# Patient Record
Sex: Male | Born: 1935 | Race: Black or African American | Hispanic: No | Smoking: Never smoker
Health system: Southern US, Community
[De-identification: ages and names within clinical notes are randomized; demographics above are authoritative.]

## PROBLEM LIST (undated history)

## (undated) DIAGNOSIS — G4730 Sleep apnea, unspecified: Secondary | ICD-10-CM

## (undated) DIAGNOSIS — I10 Essential (primary) hypertension: Secondary | ICD-10-CM

## (undated) HISTORY — PX: JOINT REPLACEMENT: SHX530

---

## 2005-06-05 ENCOUNTER — Ambulatory Visit: Payer: Self-pay | Admitting: Gastroenterology

## 2006-09-03 ENCOUNTER — Other Ambulatory Visit: Payer: Self-pay

## 2006-09-03 ENCOUNTER — Ambulatory Visit: Payer: Self-pay | Admitting: Specialist

## 2006-09-11 ENCOUNTER — Inpatient Hospital Stay: Payer: Self-pay | Admitting: Specialist

## 2006-12-12 ENCOUNTER — Ambulatory Visit: Payer: Self-pay | Admitting: Family Medicine

## 2006-12-12 ENCOUNTER — Other Ambulatory Visit: Payer: Self-pay

## 2006-12-12 ENCOUNTER — Inpatient Hospital Stay: Payer: Self-pay | Admitting: Rheumatology

## 2007-02-04 ENCOUNTER — Ambulatory Visit: Payer: Self-pay | Admitting: Gastroenterology

## 2007-03-08 ENCOUNTER — Ambulatory Visit: Payer: Self-pay | Admitting: Gastroenterology

## 2008-06-22 ENCOUNTER — Ambulatory Visit: Payer: Self-pay | Admitting: Family Medicine

## 2008-07-08 ENCOUNTER — Ambulatory Visit: Payer: Self-pay | Admitting: Specialist

## 2008-08-25 ENCOUNTER — Inpatient Hospital Stay (HOSPITAL_COMMUNITY): Admission: RE | Admit: 2008-08-25 | Discharge: 2008-08-31 | Payer: Self-pay | Admitting: Orthopedic Surgery

## 2008-10-19 ENCOUNTER — Inpatient Hospital Stay (HOSPITAL_COMMUNITY): Admission: RE | Admit: 2008-10-19 | Discharge: 2008-10-23 | Payer: Self-pay | Admitting: Orthopedic Surgery

## 2008-10-19 ENCOUNTER — Encounter (INDEPENDENT_AMBULATORY_CARE_PROVIDER_SITE_OTHER): Payer: Self-pay | Admitting: Orthopedic Surgery

## 2008-11-24 ENCOUNTER — Encounter: Payer: Self-pay | Admitting: Orthopedic Surgery

## 2008-12-06 ENCOUNTER — Encounter: Payer: Self-pay | Admitting: Orthopedic Surgery

## 2009-01-06 ENCOUNTER — Encounter: Payer: Self-pay | Admitting: Orthopedic Surgery

## 2009-06-14 ENCOUNTER — Ambulatory Visit: Payer: Self-pay | Admitting: Gastroenterology

## 2009-11-16 ENCOUNTER — Ambulatory Visit: Payer: Self-pay | Admitting: Internal Medicine

## 2010-08-15 LAB — BASIC METABOLIC PANEL
BUN: 11 mg/dL (ref 6–23)
CO2: 28 mEq/L (ref 19–32)
CO2: 31 mEq/L (ref 19–32)
Calcium: 8.2 mg/dL — ABNORMAL LOW (ref 8.4–10.5)
Chloride: 101 mEq/L (ref 96–112)
Chloride: 101 mEq/L (ref 96–112)
Creatinine, Ser: 1.34 mg/dL (ref 0.4–1.5)
Creatinine, Ser: 0.84 mg/dL (ref 0.4–1.5)
GFR calc Af Amer: >60 mL/min (ref >60)
GFR calc non Af Amer: >60 mL/min (ref >60)
Potassium: 3.6 mEq/L (ref 3.5–5.1)
Potassium: 4.1 mEq/L (ref 3.5–5.1)
Potassium: 4.1 mEq/L (ref 3.5–5.1)

## 2010-08-15 LAB — CBC
HCT: 29.7 % — ABNORMAL LOW (ref 39.0–52.0)
HCT: 32.3 % — ABNORMAL LOW (ref 39.0–52.0)
HCT: 35.3 % — ABNORMAL LOW (ref 39.0–52.0)
HCT: 47.4 % (ref 39.0–52.0)
Hemoglobin: 10 g/dL — ABNORMAL LOW (ref 13.0–17.0)
Hemoglobin: 9.4 g/dL — ABNORMAL LOW (ref 13.0–17.0)
Hemoglobin: 16 g/dL (ref 13.0–17.0)
MCHC: 33.7 g/dL (ref 30.0–36.0)
MCHC: 33.8 g/dL (ref 30.0–36.0)
MCHC: 33.8 g/dL (ref 30.0–36.0)
MCV: 88.8 fL (ref 78.0–100.0)
MCV: 89 fL (ref 78.0–100.0)
MCV: 88.4 fL (ref 78.0–100.0)
Platelets: 160 10*3/uL (ref 150–400)
Platelets: 191 10*3/uL (ref 150–400)
RBC: 3.13 MIL/uL — ABNORMAL LOW (ref 4.22–5.81)
RBC: 3.33 MIL/uL — ABNORMAL LOW (ref 4.22–5.81)
RBC: 3.64 MIL/uL — ABNORMAL LOW (ref 4.22–5.81)
RDW: 14.8 % (ref 11.5–15.5)
RDW: 14.8 % (ref 11.5–15.5)
WBC: 13.3 10*3/uL — ABNORMAL HIGH (ref 4.0–10.5)
WBC: 14.1 10*3/uL — ABNORMAL HIGH (ref 4.0–10.5)
WBC: 7 10*3/uL (ref 4.0–10.5)

## 2010-08-15 LAB — PROTIME-INR
INR: 2.4 — ABNORMAL HIGH (ref 0.00–1.49)
INR: 1.1 (ref 0.00–1.49)
Prothrombin Time: 24.9 seconds — ABNORMAL HIGH (ref 11.6–15.2)
Prothrombin Time: 17.6 seconds — ABNORMAL HIGH (ref 11.6–15.2)

## 2010-08-15 LAB — TYPE AND SCREEN
ABO/RH(D): O POS
Antibody Screen: NEGATIVE

## 2010-08-15 LAB — TISSUE CULTURE: Culture: NO GROWTH

## 2010-08-15 LAB — COMPREHENSIVE METABOLIC PANEL
ALT: <8 U/L (ref 0–53)
Albumin: 3 g/dL — ABNORMAL LOW (ref 3.5–5.2)
Alkaline Phosphatase: 90 U/L (ref 39–117)
BUN: 10 mg/dL (ref 6–23)
Chloride: 106 mEq/L (ref 96–112)
Potassium: 4.8 mEq/L (ref 3.5–5.1)
Sodium: 147 mEq/L — ABNORMAL HIGH (ref 135–145)
Total Bilirubin: 0.4 mg/dL (ref 0.3–1.2)

## 2010-08-15 LAB — URINALYSIS, ROUTINE W REFLEX MICROSCOPIC
Glucose, UA: NEGATIVE mg/dL
Hgb urine dipstick: NEGATIVE
Specific Gravity, Urine: 1.03 (ref 1.005–1.030)
Urobilinogen, UA: 1 mg/dL (ref 0.0–1.0)
pH: 6.5 (ref 5.0–8.0)

## 2010-08-15 LAB — URINE MICROSCOPIC-ADD ON

## 2010-08-15 LAB — ANAEROBIC CULTURE

## 2010-08-17 LAB — CBC
HCT: 32.2 % — ABNORMAL LOW (ref 39.0–52.0)
HCT: 32.3 % — ABNORMAL LOW (ref 39.0–52.0)
Hemoglobin: 10.9 g/dL — ABNORMAL LOW (ref 13.0–17.0)
Hemoglobin: 11 g/dL — ABNORMAL LOW (ref 13.0–17.0)
MCHC: 33.4 g/dL (ref 30.0–36.0)
MCV: 86.8 fL (ref 78.0–100.0)
MCV: 88.2 fL (ref 78.0–100.0)
Platelets: 247 10*3/uL (ref 150–400)
RBC: 3.65 MIL/uL — ABNORMAL LOW (ref 4.22–5.81)
RBC: 3.7 MIL/uL — ABNORMAL LOW (ref 4.22–5.81)
RBC: 4.22 MIL/uL (ref 4.22–5.81)
RDW: 13.7 % (ref 11.5–15.5)
RDW: 13.5 % (ref 11.5–15.5)
WBC: 7.6 10*3/uL (ref 4.0–10.5)
WBC: 8.1 10*3/uL (ref 4.0–10.5)
WBC: 8.3 10*3/uL (ref 4.0–10.5)

## 2010-08-17 LAB — BASIC METABOLIC PANEL
Chloride: 103 mEq/L (ref 96–112)
Creatinine, Ser: 0.74 mg/dL (ref 0.4–1.5)
GFR calc Af Amer: >60 mL/min (ref >60)
GFR calc Af Amer: >60 mL/min (ref >60)
GFR calc non Af Amer: >60 mL/min (ref >60)
Glucose, Bld: 119 mg/dL — ABNORMAL HIGH (ref 70–99)
Potassium: 3.5 mEq/L (ref 3.5–5.1)
Potassium: 4.3 mEq/L (ref 3.5–5.1)
Sodium: 136 mEq/L (ref 135–145)

## 2010-08-17 LAB — PROTIME-INR
INR: 2.1 — ABNORMAL HIGH (ref 0.00–1.49)
INR: 2.2 — ABNORMAL HIGH (ref 0.00–1.49)
INR: 2.2 — ABNORMAL HIGH (ref 0.00–1.49)
INR: 1.2 (ref 0.00–1.49)
Prothrombin Time: 15.5 seconds — ABNORMAL HIGH (ref 11.6–15.2)
Prothrombin Time: 25.2 seconds — ABNORMAL HIGH (ref 11.6–15.2)
Prothrombin Time: 25.4 seconds — ABNORMAL HIGH (ref 11.6–15.2)
Prothrombin Time: 25.8 seconds — ABNORMAL HIGH (ref 11.6–15.2)
Prothrombin Time: 15.3 seconds — ABNORMAL HIGH (ref 11.6–15.2)

## 2010-08-17 LAB — URINE MICROSCOPIC-ADD ON

## 2010-08-17 LAB — ANAEROBIC CULTURE

## 2010-08-17 LAB — COMPREHENSIVE METABOLIC PANEL
ALT: 12 U/L (ref 0–53)
Albumin: 2.5 g/dL — ABNORMAL LOW (ref 3.5–5.2)
Alkaline Phosphatase: 70 U/L (ref 39–117)
BUN: 5 mg/dL — ABNORMAL LOW (ref 6–23)
Calcium: 9.6 mg/dL (ref 8.4–10.5)
Potassium: 4.3 mEq/L (ref 3.5–5.1)
Sodium: 141 mEq/L (ref 135–145)
Total Protein: 7.6 g/dL (ref 6.0–8.3)

## 2010-08-17 LAB — BODY FLUID CULTURE

## 2010-08-17 LAB — GLUCOSE, CAPILLARY: Glucose-Capillary: 114 mg/dL — ABNORMAL HIGH (ref 70–99)

## 2010-08-17 LAB — URINALYSIS, ROUTINE W REFLEX MICROSCOPIC
Leukocytes, UA: NEGATIVE
Protein, ur: 30 mg/dL — AB
Specific Gravity, Urine: 1.026 (ref 1.005–1.030)
Urobilinogen, UA: 1 mg/dL (ref 0.0–1.0)

## 2010-08-17 LAB — TYPE AND SCREEN: Antibody Screen: NEGATIVE

## 2010-09-20 NOTE — Op Note (Signed)
[name], [name]                ACCOUNT NO.:  [id number]   MEDICAL RECORD NO.:  [id number]          PATIENT TYPE:  INP   LOCATION:  0001                         FACILITY:  [facility]   PHYSICIAN:  Ollen Gross, M.D.    DATE OF BIRTH:  December 21, 1934   DATE OF PROCEDURE:  08/25/2008  DATE OF DISCHARGE:                               OPERATIVE REPORT   PREOPERATIVE DIAGNOSIS:  Infected left total knee arthroplasty.   POSTOPERATIVE DIAGNOSIS:  Infected left total knee arthroplasty.   PROCEDURE:  Left knee resection arthroplasty, antibiotic spacer  placement.   SURGEON:  Ollen Gross, M.D.   ASSISTANT:  Avel Peace, P.A.-C.   ANESTHESIA:  Spinal.   ESTIMATED BLOOD LOSS:  Minimal.   DRAINS:  Hemovac x1.   TOURNIQUET TIME:  70 minutes at 300 mmHg.   COMPLICATIONS:  None.   CONDITION:  Stable to recovery.   CLINICAL NOTE:  [name] is a 76 year old male who had a left total  knee arthroplasty done a few years ago in [place].  He never got good  pain resolution.  This past January, he had an episode where he fell and  hit the knee.  He had increased pain and swelling.  He had an aspiration  by Dr. Hyacinth Meeker showing purulent fluid growing out strep.  He developed a  sinus tract.  He presents now for resection arthroplasty antibiotic  spacer placement.   PROCEDURE IN DETAIL:  After successful administration of spinal  anesthetic, a tourniquet was placed high on the left thigh, and left  lower extremity was prepped and draped in the usual sterile fashion.  Extremity was wrapped in Esmarch, knee flexed.  Tourniquet inflated to  300 mmHg.  Midline incision made with a 10 blade through subcutaneous  tissue to the level of the extensor mechanism.  A fresh blade was used  make a medial parapatellar arthrotomy.  He tremendous scarring.  He had  pus in his subcutaneous tissue and a sinus tract and sent that for Gram  stain C and S.  I did a thorough scar debridement to mobilize the  tissue.  We were able to mobilize the mediolateral gutters and  subperiosteal elevate the tissue off the proximal medial tibia and  mobilize that.  Laterally, removed the scar and elevated the tissue off  the lateral tibia, avoiding the patellar tendon.  I then removed the  polyethylene from the mobile bearing patella and was then able to evert  the patella.  We flexed the knee about 70 degrees and was able to get  out the tibial polyethylene.  The junction between the femoral component  of bone was then interrupted with osteotomes and the femoral component  removed with minimal if any bone loss.  The cement was then also  removed.  We then subluxed the tibia forward and with an oscillating saw  disrupted the interface between the tibial component and bone and then  removed the tibial component, again with minimal if any blood loss.  We  then removed the cement from the canal.  I did a thorough debridement of  all abnormal looking tissue.  We then thoroughly irrigated with 6 liters  saline using pulsatile lavage.  I was then able to remove the metal-  backed patellar component.  We then mixed 3 batches of cement with 3 g  of vancomycin and 3.6 g of tobramycin.  I constructed a spacer to fit  the dimensions of the extension gap.  Once the cement was fully  hardened, the spacer was placed.  There was very good stability to the  knee.  We then thoroughly irrigated more and closed the arthrotomy with  interrupted #1 PDS over a Hemovac drain.  The tourniquet was released  for total time of about 70 minutes.  Subcutaneous tissue was then closed  interrupted 2-0 Vicryl and skin with staples.  The drain was hooked to  suction, the incision cleaned and dried, and a bulky sterile dressing  applied.  It was placed into a knee immobilizer, awakened and  transported to recovery in stable condition.      Ollen Gross, M.D.  Electronically Signed     FA/MEDQ  D:  08/25/2008  T:  08/26/2008  Job:   [id number]

## 2010-09-20 NOTE — Discharge Summary (Signed)
[name], [name]                ACCOUNT NO.:  [id number]   MEDICAL RECORD NO.:  [id number]          PATIENT TYPE:  INP   LOCATION:  1619                         FACILITY:  [facility]   PHYSICIAN:  Ollen Gross, M.D.    DATE OF BIRTH:  1934/06/11   DATE OF ADMISSION:  08/25/2008  DATE OF DISCHARGE:  08/31/2008                               DISCHARGE SUMMARY   ADDENDUM:   ADMISSION AND DISCHARGE DIAGNOSES:  Per previous discharge summary.   PROCEDURE AND HISTORY:  Per previous discharge summary.   ADDITIONAL LABORATORY DATA:  Serial protimes continue to be followed  throughout the hospital course.  INR remained therapeutic and last  protime/INR was 25.2 and 2.1.   HOSPITAL COURSE:  The patient has been in the hospital since April 20  and was supposed to have a bed on April 23,.  However, arrangements were  not made in time and he did not have a bed by the end of the week.  The  patient stayed in the hospital through the weekend.  On the April 24 and  April 25, continued receive IV antibiotics via PICC line with no  complaints.  He was up walking the hallways.  He was seen back on rounds  on morning of April 26 by Dr. Lequita Halt. A  bed was available at [facility],  arrangements being made.  The patient to be transferred today.   DISCHARGE/PLAN:  Discharge plan and meds as per previously dictated  summary.   ACTIVITY:  Per previous dictated summary.   FOLLOW UP:  Needs to follow up  in the office in approximately another 2  weeks on May 11, Tuesday.  Please contact the office of [phone] to make  arrangements for follow-up appointment.   DISPOSITION:  [facility].   CONDITION ON DISCHARGE:  Improved.      Alexzandrew L. Perkins, P.A.C.      Ollen Gross, M.D.  Electronically Signed    ALP/MEDQ  D:  08/31/2008  T:  08/31/2008  Job:  [id number]

## 2010-09-20 NOTE — Discharge Summary (Signed)
NAMECORRELL, DENBOW                ACCOUNT NO.:  [id number]   MEDICAL RECORD NO.:  [id number]          PATIENT TYPE:  INP   LOCATION:  1619                         FACILITY:  [facility]   PHYSICIAN:  Ollen Gross, M.D.    DATE OF BIRTH:  05/18/1934   DATE OF ADMISSION:  10/19/2008  DATE OF DISCHARGE:  10/22/2008                               DISCHARGE SUMMARY   DISCHARGE DIAGNOSIS:  Resection arthroplasty of left knee secondary to  infection.   OTHER DIAGNOSES:  None.   PROCEDURE:  Left total knee arthroplasty reimplantation on October 19, 2008.   HOSPITAL COURSE:  Mr. Gum is a 76 year old male admitted via the  operating room on October 19, 2008 at which time he underwent left total  knee arthroplasty reimplantation.  This is a second stage of two-stage  treatment for an infected total knee arthroplasty.  Intraoperatively,  frozen sections and gram stains were negative.  He tolerated the  procedure well with  estimated blood loss of 200.  He was transported to  the recovery room in stable condition and to the orthopedic floor that  evening in stable condition.  Left lower extremity remained  neurovascularly intact throughout his hospital course.  On postoperative  day number 1, his pain is well controlled.  His hemoglobin is 11.9,  white count is elevated at 14, INR 1.4 and BMET normal with a creatinine  of 0.84.  He was out of bed to chair on postoperative day number 1.  Postoperative day number 2, his Foley catheter was discontinued as is  his PCA pump and his IV is Hep-locked.  Labs on postoperative day number  2 show a hemoglobin of 10.9, white count down to 13, INR 2.1 and BMET  normal with the exception of his creatinine having bumped a little to  1.34.  His urine output has been adequate.  He is hemodynamically stable  with vital signs being stable.  He is to ambulate on October 21, 2008.  Anticipated discharge to skilled nursing facility will be on October 22, 2008 or October 23, 2008.   We will be repeating his BMET on the morning of  October 22, 2008 to make sure his creatinine has returned back to normal.   DISCHARGE MEDICATIONS:  1. Flomax 0.4 mg q.a.m.  2. Percocet 5/325 one-two tablets every 4-6 hours as needed for pain.  3. Robaxin 500 mg p.o. q.6 h. as needed for spasm.  4. Tramadol 50 mg p.o. q.6 h. as needed for pain.  5. Coumadin as per pharmacy protocol.  The protocol will be to keep      his INR between 2-3.  The Coumadin will be an order for a total of      21 days postop, thus will be discontinued on November 08, 2008.  His      dosages of Coumadin in the hospital were 5 mg on October 21, 2008, 7.5      mg on October 20, 2008 and 7.5 mg on October 19, 2008.   ACTIVITY LEVEL:  Weightbearing as tolerated to the left lower  extremity.  He would benefit from CPM machine range of motion 10-50 for a total of 4-  6 hours a day split up into 2-3 sessions and increasing flexion by 10-15  degrees a day as tolerated up until 90 degrees of flexion.   FOLLOW UP:  Followup will be with Dr. Lequita Halt on Thursday, October 29, 2008  or Friday, October 30, 2008.  Please call the office at [phone] to make  this appointment.  The addendum to discharge summary with updated labs  will be completed at the time of discharge.      Ollen Gross, M.D.  Electronically Signed     FA/MEDQ  D:  10/21/2008  T:  10/21/2008  Job:  [id number]

## 2010-09-20 NOTE — Op Note (Signed)
[name], [name]                ACCOUNT NO.:  [id number]   MEDICAL RECORD NO.:  [id number]          PATIENT TYPE:  INP   LOCATION:  6213                         FACILITY:  [facility]   PHYSICIAN:  Ollen Gross, M.D.    DATE OF BIRTH:  [birth year]   DATE OF PROCEDURE:  10/19/2008  DATE OF DISCHARGE:                               OPERATIVE REPORT   PREOPERATIVE DIAGNOSIS:  Resection arthroplasty, left knee, secondary to  infection.   POSTOPERATIVE DIAGNOSIS:  Resection arthroplasty, left knee, secondary  to infection.   PROCEDURE:  Left total knee arthroplasty reimplantation.   SURGEON:  Ollen Gross, MD.   ASSISTANT:  Georges Lynch. Gioffre, MD.   ANESTHESIA:  General.   ESTIMATED BLOOD LOSS:  200.   DRAIN:  Hemovac x1.   TOURNIQUET TIME:  Up 15 minutes at 300 mmHg, down 7 minutes, up  additional 19 minutes at 300 mmHg.   COMPLICATIONS:  None.   CONDITION:  Stable to recovery.   BRIEF CLINICAL NOTE:  [name] is a 76 year old male underwent  resection arthroplasty of his left knee with the placement of antibiotic  spacer on August 25, 2008.  He has had approximately 8 weeks of IV Ancef.  He clinically has cleared the infection.  He presents now for total knee  arthroplasty reimplantation.   PROCEDURE IN DETAIL:  After the successful administration of general  anesthetic, a tourniquet was placed on the left thigh, and left lower  extremity prepped and draped in the usual sterile fashion.  The  extremity is wrapped in an Esmarch and tourniquet inflated to 300 mmHg.  Midline incision is made with a 10-blade through subcutaneous tissue to  the level of the extensor mechanism.  A fresh blade is used make a  medial parapatellar arthrotomy.  We did not encounter any purulent  fluid, in fact, encountered barely any fluid in the joint.  We sent the  blade fluid for STAT gram stain and C and S, and it showed rare white  cells and no organisms.  The soft tissue on the proximal medial  tibia  subperiosteally elevated to the joint line with a knife and surround the  semimembranosus bursa with a Cobb elevator.  Soft tissue laterally is  elevated with attention being paid to avoid any patellotendon on the  tibial tubercle.  I had to excise scar to free up the medial and lateral  gutters.  Once we did free everything up, we  were able to flex the knee  and sublux the patella laterally.  I then cleared off the granulation  tissue from the surfaces of the femur and tibia.  We took the 2 most  inflamed-appearing specimens and sent them to Pathology for frozen  section analysis.  Both showed that there was no acute inflammation.  Given the clinical criteria plus the objective findings, I felt that we  did not have infection and we planned for reimplantation.   The canals of the femur and the tibia initiated with the starter drill.  We thoroughly irrigated both canals and reamed on the femoral side up to  22 mm, the tibial side to 13 mm.  The tibia is then subluxed forward and  the extramedullary tibial alignment guide is placed with a 2-degree  cutting block.  We placed it approximately at the medial aspect of  tibial tubercle and distally along the second metatarsal axis of the  tibial crest.  It took proximally 4 mm of bone to get down to normal-  appearing bone.  The resection is made with an oscillating saw.  I  prepared for a 29-mm sleeve by impacting the sleeve in.  We then removed  the sleeve and prepared proximally with the reamer for the size 4 MBT  revision tray.  We then did our keel punch through the trial tray, which  was a size 4.   The 22-mm reamer was placed into the femur to serve as our cutting  guide.  The 5-degree left valgus alignment guide is placed and I placed  it to resect about 2 mm off the distal femur.  This got [place] down to  healthy-appearing bone.  A size 5 was the most appropriate femoral  component.  The size 5 AP cutting block is placed in a +2  position to  effectively raise the stem and lower the component down though the  anterior aspect of the femur.  I placed a 15 mm spacer block in 90  degrees of flexion to create a rectangular flexion space.  This marked  our rotation for the femoral cut.  We did not get any bone anteriorly  and had to go into +4 position to get any bone posteriorly both medial  and lateral.  The revision blocks placed, and the chamfer cuts and  intercondylar cut for the TC3 are made.  We then placed spacer blocks.  The space was about 25 mm in flexion and about 21 extension.  Thus, with  the 4 mm posterior augments, it would balance things out.  We then  placed the trial tibia with 10 mm medial and lateral augments and a 29  sleeve.  These tense so we would have more options on our insert.  On  the femoral side, it was a size 5 TC3 with the 22 x 75 stem extension in  a +2 position and 4 mm medial and lateral posterior augments.  The  trials are placed and then we went with a 15-mm insert, which was  perfect.  The knee was then 5 degrees of full extension and had  excellent stability, varus-valgus, and anterior-posterior throughout  full range of motion.  We then everted the patella.  The residual  thickness of the patella was about 17 mm.  I removed the soft tissue and  then cut it down to about 14 mm.  The template for a 41 is placed, the  lug holes are drilled, and the trial patella is placed and it tracks  normally.  We then let the tourniquet down for total time of 15 minutes.  I held it down for 10 minutes while we were preparing the insertable  components on the back table.  Once again on the tibial side, a size 4  MBT revision tray, a 13 x 30 stem extension, a 29 sleeve, and 10 mm  medial and lateral augments.  On the femoral side, a size 5 TC3 revision  femur with 4 mm medial and lateral posterior augments, the 22 x 75 stem  in a +2 position and 5 degree left.  After 8 minutes, we rewrapped  leg   in Esmarch and reinflated the tourniquet to 300 mmHg.  We then  thoroughly irrigated out all the bony surfaces.  The size 6 cement  restricter is the most appropriate tibial restricter so that is placed  into the appropriate depth in the tibial canal.  We irrigated again and  mixed the cement, 3 batches of cement, and 3 g of vancomycin.  The  cement is injected into the tibial canal and then tibial component is  cemented in place and impacted with all extruded cement removed.  The  femoral component is cemented distally with a Press-Fit stem.  This is  also impacted and extruded cement removed.  I placed a 15 mm trial  insert, which allowed for coming within 5 degrees of full extension with  excellent varus-valgus, anterior-posterior balance throughout full range  of motion.  The 41 patella is then cemented into place.  The patella is  held with a clamp.  Once again, all extruded cement is removed.  Once  the cement is fully hardened then the permanent 15 mm TC3 RP insert is  placed into the tibial tray.  Once again, there is excellent stability  throughout full range of motion.  We thoroughly irrigated again and then  placed some FloSeal into the medial and lateral gutters of the  suprapatellar area.  A moist sponge is placed, and the tourniquet  released for a second time of 19 minutes.  The FloSeal was then washed  out after sponges held in for 2 minutes.  Mild bleeding is encountered  and stopped with electrocautery.  The arthrotomy is then closed over a  Hemovac drain with interrupted #1 Vicryl, and flexion against gravity is  about 110 degrees.  The subcu is  closed with interrupted 2-0 Vicryl and the skin with staples.  A drain  is hooked to suction, the incision cleaned and dried, and a big, bulky,  sterile dressing is applied.  He is placed into a knee immobilizer,  awakened, and transported to recovery in stable condition.      Ollen Gross, M.D.  Electronically  Signed     FA/MEDQ  D:  10/19/2008  T:  10/19/2008  Job:  [id number]

## 2010-09-20 NOTE — Discharge Summary (Signed)
[name], [name]                ACCOUNT NO.:  [id number]   MEDICAL RECORD NO.:  [id number]          PATIENT TYPE:  INP   LOCATION:  1619                         FACILITY:  [facility]   PHYSICIAN:  Ollen Gross, M.D.    DATE OF BIRTH:  1935/01/17   DATE OF ADMISSION:  08/25/2008  DATE OF DISCHARGE:  08/28/2008                               DISCHARGE SUMMARY   ADMISSION DIAGNOSES:  1. Infected left total knee.  2. History of peptic ulcer disease.  3. Urinary retention.   DISCHARGE DIAGNOSES:  1. Had infected left total knee arthroplasty, status post left knee      resection arthroplasty with placement antibiotic spacer.  2. History of peptic ulcer disease.  3. Urinary retention.  4. Postoperative atelectasis.   PROCEDURE:  On August 25, 2008, left knee resection arthroplasty and  placement antibiotic spacer.  Surgeon was Dr. Lequita Halt. Assistant Avel Peace PA-C.  Spinal anesthesia.  Consults none.   BRIEF HISTORY:  Mr. Fate Caster is a 73-year male with a left total  knee awhile back in [place], [place]. Never got really good  pain resolution.  Had episode where he fell and had increased pain and  swelling.  Aspiration showed purulent fluid, growing strep, developed  sinus tract,  presented to Dr. Lequita Halt, now presents for resection  arthroplasty.   LABORATORY DATA:  Preop CBC showed hemoglobin of 14.1, hematocrit 42.3  with normal white count 8.4, platelets 247,000.  PT/INR 15.3 and 1.2,  PTT of 31.  Chem panel on admission showed slightly elevated glucose  125, slightly low albumin of 2.5.  Remaining chem panel within normal  limits.  Urinalysis showed small bili, trace ketones, trace blood, 0-2  red cells, few bacteria.  Cultures taken at time of surgery showed  smear, moderate wbc's of the decision no organisms, no growth at the  culture.  Anaerobe culture:  No anaerobes isolated.  Serial CBCs were  followed during the hospital course.  Hemoglobin dropped down to  12.3  then to 11.  Last H&H stabilized 10.9 and 32.2.  Serial protimes  followed per Coumadin protocol.  PT/INR are 25.1 and 2.1.  Serial BMETs  following admission.  Electrolytes remained within normal limits.   Chest x-ray on August 27, 2008, PICC line placement at cavoatrial  junction and bibasilar atelectasis.  EKG  on August 19, 2008,  normal  sinus rhythm with sinus arrhythmia, normal EKG confirmed by Dr.  Truett Perna.  al   HOSPITAL COURSE:  The patient was admitted to [facility],  taken to OR, underwent above-stated procedure without complication.  The  patient tolerated the procedure well, was transferred to the orthopedic  floor.  PCA and p.o. analgesics.  Started on IV Ancef.  He had a PICC  line ordered, was placed on day #2.  He was doing pretty well on morning  of day #1.  Hemoglobin looked good.  Electrolytes looked good.  Started  getting up out of bed.  PICC line was placed on day #2.  Dressing  changed, drain removed.  Incision looked good.  Hemoglobin  was stable.  Continued on IV antibiotics.  The culture did not grow out anything in  particular and no growth on culture.  By day #3, he was doing well.  The  knee looked clinically improved.  Had an antibiotic spacer in so there  was no motion or bending, but he was able to weight bear as tolerate,  and get up with therapy.  We were  looking for a bed for placement in a  skilled facility.  The patient looked good and stable on day #3 and  hopefully there was a bed available.  We were going to make arrangements  and allow him to be transferred at that time.  We are waiting on final  bed availability.  He was doing well and able to be discharged home  August 28, 2008 depending on bed availability.   DISCHARGE/PLAN:  1. Tentative discharge on August 28, 2008.  2. Discharge diagnoses, please see above.   DISCHARGE MEDICATIONS:  1. Coumadin protocol.  Please titrate the Coumadin level for target      INR between 2  and 3.  Needs to be on Coumadin for 3 weeks from date      of surgery.  2. Colace 100 mg p.o. b.i.d.  3. Flomax 0.4 mg daily.  4. Ancef 1 g q.8 h via PICC line.  He needs to be on Ancef for six      weeks  from the date of surgery, August 25, 2008.  5. Robaxin 500 mg p.o. q.6 h p.r.n. spasm.  6. Percocet 5 mg one or two every 4 hours as needed for pain.  7. Tramadol 50 mg p.o. q.6 h p.r.n. mild pain.   DIET:  As tolerated.   ACTIVITY:  He may be weightbearing as tolerated to the left lower  extremity.  No motion, no flexion.  The patient has an antibiotic spacer  so there is no mobile joint.  He needs the knee immobilizer at all  times.  He may be weightbearing as tolerated.  He may start showering  with the leg supported at all times, otherwise just removed the knee  immobilizer for sponge bathing; otherwise knee immobilizer at all times.  Physical therapy and occupational therapy:  Continued therapy for  ambulation.  Again, no motion or bending to the knee.   FOLLOW UP:  Follow up with Dr. Lequita Halt in the office approximately 2  weeks on Tuesday, May 11.  Please contact the office at [phone] to  arrange an appointment.   DISPOSITION:  Pending at the time of dictation.  Waiting on bed  availability.   CONDITION ON DISCHARGE:  Improving.      Alexzandrew L. Perkins, P.A.C.      Ollen Gross, M.D.  Electronically Signed    ALP/MEDQ  D:  08/28/2008  T:  08/28/2008  Job:  [id number]

## 2010-09-20 NOTE — H&P (Signed)
[name], [name]                ACCOUNT NO.:  [id number]   MEDICAL RECORD NO.:  [id number]          PATIENT TYPE:  INP   LOCATION:  NA                           FACILITY:  [facility]   PHYSICIAN:  Ollen Gross, M.D.    DATE OF BIRTH:  1934/10/21   DATE OF ADMISSION:  10/19/2008  DATE OF DISCHARGE:                              HISTORY & PHYSICAL   CHIEF COMPLAINT:  Infected knee, status post resection.   HISTORY OF PRESENT ILLNESS:  The patient is a 73-year male seen by Dr.  Lequita Halt with previous infection, left total knee, underwent a resection  arthroplasty back in April 2010, and now he presents for reimplantation  of the left total knee following resection and IV antibiotics.   ALLERGIES:  NO KNOWN DRUG ALLERGIES.   CURRENT MEDICATIONS:  Coumadin, Colace, Flomax, Robaxin, Milk of  Magnesia, Percocet, tramadol.   PAST MEDICAL HISTORY:  History of peptic ulcer disease, history of  urinary retention.   PAST SURGICAL HISTORY:  Knee scope about 30 years ago, left total knee,  and most recently resection of left total knee.   FAMILY HISTORY:  Father deceased in his 40s with history of alcohol.  Mother deceased in her 10s.   SOCIAL HISTORY:  Single, two children.  No tobacco, a previous use of  alcohol but none in the past 2 years.   REVIEW OF SYSTEMS:  GENERAL:  No fevers, chills or night sweats.  NEURO:  No seizures, syncope or paralysis.  RESPIRATORY:  No productive cough or  hemoptysis.  CARDIOVASCULAR:  No chest pain, no angina, no orthopnea.  GI:  No nausea, vomiting, diarrhea or constipation.  GU:  No dysuria,  hematuria or discharge.  MUSCULOSKELETAL:  Left knee.   PHYSICAL EXAMINATION:  VITAL SIGNS:  Pulse 60, respirations 12, blood  pressure 122/80.  GENERAL:  A 76 year old African American male well-nourished, well-  developed, in no acute distress, tall, slender frame, accompanied by his  family.  HEENT:  Normocephalic, atraumatic.  Pupils are round and reactive.  Oropharynx clear.  EOMs intact.  NECK:  Supple.  CHEST:  Clear.  HEART:  Regular rate and rhythm.  No murmur, S1, S2 noted.  ABDOMEN:  Soft, nontender.  Bowel sounds present.  RECTAL/BREASTS/GENITALIA:  Not pertinent to present illness.  EXTREMITIES:  Left knee:  No motion, spacer in place.  Motor function  intact to foot and toes.  Incision healed okay.   IMPRESSION:  Infected left total knee, status post resection.   PLAN:  The patient is admitted to [facility] to undergo a  reimplantation of left total knee surgery which will be performed by Dr.  Ollen Gross.      [name], P.A.C.      Ollen Gross, M.D.  Electronically Signed    ALP/MEDQ  D:  10/16/2008  T:  10/17/2008  Job:  [id number]

## 2010-09-20 NOTE — H&P (Signed)
[name], [name]                ACCOUNT NO.:  [id number]   MEDICAL RECORD NO.:  [id number]          PATIENT TYPE:  INP   LOCATION:  1619                         FACILITY:  [facility]   PHYSICIAN:  Ollen Gross, M.D.    DATE OF BIRTH:  1935-01-24   DATE OF ADMISSION:  08/25/2008  DATE OF DISCHARGE:                              HISTORY & PHYSICAL   CHIEF COMPLAINT:  Infected left total knee.   HISTORY OF PRESENT ILLNESS:  The patient is a 76 year old male who has  been seen and evaluated for a second opinion concerning an infected  draining left total knee.  He was referred over for evaluation.  He  originally underwent a total knee back in May 2008, but unfortunately  has developed some pain which is ongoing and has actually a small sinus  tract which was draining once he was seen in the office.  He had a  culture report sent over from [facility] dated  August 04, 2008 which showed heavy growth of Streptococcus agalactiae,  which was a group B strep.  Unfortunately, due to the significant  infection with his total knee, it is felt that he would best be served  by undergoing resection arthroplasty and placement of an antibiotic  spacer.  Risks and benefits of this procedure have been discussed with  the patient.  He does understand this is stage one of a two-stage  procedure undergoing resection at first and IV antibiotics and  reimplantation at a later time.  The patient is subsequently admitted to  the hospital.   ALLERGIES:  NO KNOWN DRUG ALLERGIES.   CURRENT MEDICATIONS:  Flomax, tramadol, penicillin.   PAST MEDICAL HISTORY:  1. History of peptic ulcer disease.  2. Urinary retention.   PAST SURGICAL HISTORY:  1. Left total knee arthroplasty.  2,  He also has undergone a knee scope approximately 30 years ago.   FAMILY HISTORY:  Father deceased in his 38s with the history of alcohol.  Mother deceased in her 56s.   SOCIAL HISTORY:  He is single and has 2  children.  No tobacco. Previous  use of alcohol but none in the past 2 years.   REVIEW OF SYSTEMS:  GENERAL:  No fevers, chills, although he has some  night sweats.  NEURO:  No seizures, syncope or paralysis.  RESPIRATORY:  A little bit of a nonproductive cough.  No productive  cough or hemoptysis.  CARDIOVASCULAR:  No chest pain or orthopnea.  GI: No nausea, vomiting, diarrhea or constipation.  GU: A little bit of urinary retention.  No dysuria or hematuria.  MUSCULOSKELETAL: Left  knee.   PHYSICAL:  VITAL SIGNS: Pulse 76, respirations14, blood pressure 118/78.  GENERAL:  A 76 year old tall African American male ,well-nourished, well-  developed, in no acute distress.  He is alert and cooperative,  accompanied by his family.  HEENT: Normocephalic, atraumatic.  Pupils are round and reactive.  EOMs  intact.  NECK:  Supple.  CHEST: Clear.  HEART: Regular rate and rhythm.  No murmur, S1-S2 noted.  ABDOMEN: Soft, nontender, slightly round.  Bowel sounds present.  RECTAL/BREASTS/GENITALIA:  Not done.  Not pertinent to present illness.  EXTREMITIES:  Left knee:  He has got a small anterior sinus tract  draining on the anterior knee distal to the incision, motor function is  intact, upper incision is okay.   IMPRESSION:  Infected left total knee.   PLAN:  The patient is admitted to [facility] to undergo  resection arthroplasty with placement of antibiotic spacer.  Surgery  will be performed by Dr. Ollen Gross.      [name], P.A.C.      Ollen Gross, M.D.  Electronically Signed    ALP/MEDQ  D:  08/27/2008  T:  08/28/2008  Job:  [id number]

## 2010-09-20 NOTE — Discharge Summary (Signed)
[name], [name]                ACCOUNT NO.:  [id number]   MEDICAL RECORD NO.:  [id number]          PATIENT TYPE:  INP   LOCATION:  1619                         FACILITY:  [facility]   PHYSICIAN:  Madlyn Frankel. Charlann Boxer, M.D.  DATE OF BIRTH:  1934/07/14   DATE OF ADMISSION:  10/19/2008  DATE OF DISCHARGE:                               DISCHARGE SUMMARY   DISCHARGE SUMMARY ADDENDUM:  HOSPITAL COURSE:  The patient remained in hospital an additional day,  rechecked his metabolic panel as well as his PT and CBC.  On the morning  of 6/17 he was stable, had made good progress with physical therapy  yesterday with improving quad function.  Labwise his BMET showed his  creatinine had come down to 0.79.  Overall his metabolic panel showed  sodium 161, potassium 3.6, BUN was 13.  His creatinine 0.79, glucose  114.  His PT/INR showed it was 20.4 and 2.5 respectively.  His CBC:  white cell 8.8, hemoglobin 10, platelets 140.  Based upon his  hemodynamic stabilization and improving creatinine clearance, he was  ready for discharge to skilled nursing facility.   DISCHARGE DISPOSITION:  Discharged to skilled nursing facility in stable  improved condition.     ______________________________  Yetta Glassman [name], [place]      Madlyn Frankel. Charlann Boxer, M.D.  Electronically Signed    BLM/MEDQ  D:  10/22/2008  T:  10/22/2008  Job:  [id number]

## 2010-10-11 ENCOUNTER — Emergency Department: Payer: Self-pay | Admitting: Emergency Medicine

## 2011-12-28 ENCOUNTER — Emergency Department: Payer: Self-pay | Admitting: Emergency Medicine

## 2012-01-02 ENCOUNTER — Emergency Department: Payer: Self-pay | Admitting: Emergency Medicine

## 2014-05-22 ENCOUNTER — Ambulatory Visit: Payer: Self-pay | Admitting: Urology

## 2015-02-26 ENCOUNTER — Emergency Department
Admission: EM | Admit: 2015-02-26 | Discharge: 2015-02-27 | Disposition: A | Discharge status: Home / Self Care | Payer: Medicare Other | Attending: Emergency Medicine | Admitting: Emergency Medicine

## 2015-02-26 DIAGNOSIS — R42 Dizziness and giddiness: Secondary | ICD-10-CM | POA: Insufficient documentation

## 2015-02-26 DIAGNOSIS — R51 Headache: Secondary | ICD-10-CM | POA: Insufficient documentation

## 2015-02-26 DIAGNOSIS — M542 Cervicalgia: Secondary | ICD-10-CM | POA: Insufficient documentation

## 2015-02-26 DIAGNOSIS — E869 Volume depletion, unspecified: Secondary | ICD-10-CM | POA: Diagnosis not present

## 2015-02-26 DIAGNOSIS — I10 Essential (primary) hypertension: Secondary | ICD-10-CM | POA: Insufficient documentation

## 2015-02-26 DIAGNOSIS — R509 Fever, unspecified: Secondary | ICD-10-CM | POA: Diagnosis not present

## 2015-02-26 DIAGNOSIS — R21 Rash and other nonspecific skin eruption: Secondary | ICD-10-CM | POA: Diagnosis present

## 2015-02-26 HISTORY — DX: Sleep apnea, unspecified: G47.30

## 2015-02-26 HISTORY — DX: Essential (primary) hypertension: I10

## 2015-02-26 NOTE — ED Notes (Signed)
Pt c/o lightheadedness beginning this morning.  Pt reports neck pain 8 out of 10 with movement.  Pt denies LOC and fall.  Pt reports having chills this morning, and pt's daughter reports pt felt warm to touch.

## 2015-02-26 NOTE — ED Notes (Signed)
Pts daughter reports pt had headache earlier today.  Pt took 2 tylenol for headache.

## 2015-02-27 LAB — CBC WITH DIFFERENTIAL/PLATELET
Basophils Absolute: 0 10*3/uL (ref 0–0.1)
Basophils Relative: 1 %
EOS ABS: 0.2 10*3/uL (ref 0–0.7)
EOS PCT: 3 %
HCT: 45.9 % (ref 40.0–52.0)
Hemoglobin: 15.3 g/dL (ref 13.0–18.0)
LYMPHS ABS: 1 10*3/uL (ref 1.0–3.6)
Lymphocytes Relative: 13 %
MCH: 29.5 pg (ref 26.0–34.0)
MCHC: 33.3 g/dL (ref 32.0–36.0)
MCV: 88.7 fL (ref 80.0–100.0)
MONO ABS: 0.5 10*3/uL (ref 0.2–1.0)
Monocytes Relative: 6 %
Neutro Abs: 6.3 10*3/uL (ref 1.4–6.5)
Neutrophils Relative %: 79 %
PLATELETS: 176 10*3/uL (ref 150–440)
RBC: 5.18 MIL/uL (ref 4.40–5.90)
RDW: 13.8 % (ref 11.5–14.5)
WBC: 8 10*3/uL (ref 3.8–10.6)

## 2015-02-27 LAB — COMPREHENSIVE METABOLIC PANEL
ALBUMIN: 3.1 g/dL — AB (ref 3.5–5.0)
ALK PHOS: 57 U/L (ref 38–126)
ALT: 9 U/L — AB (ref 17–63)
ANION GAP: 5 (ref 5–15)
AST: 14 U/L — AB (ref 15–41)
BUN: 16 mg/dL (ref 6–20)
CALCIUM: 8.9 mg/dL (ref 8.9–10.3)
CO2: 28 mmol/L (ref 22–32)
Chloride: 105 mmol/L (ref 101–111)
Creatinine, Ser: 0.91 mg/dL (ref 0.61–1.24)
GFR calc Af Amer: >60 mL/min (ref >60)
GFR calc non Af Amer: >60 mL/min (ref >60)
GLUCOSE: 99 mg/dL (ref 65–99)
Potassium: 4.1 mmol/L (ref 3.5–5.1)
SODIUM: 138 mmol/L (ref 135–145)
Total Bilirubin: 0.7 mg/dL (ref 0.3–1.2)
Total Protein: 7.1 g/dL (ref 6.5–8.1)

## 2015-02-27 LAB — TROPONIN I

## 2015-02-27 MED ORDER — SODIUM CHLORIDE 0.9 % IV BOLUS (SEPSIS)
500.0000 mL | INTRAVENOUS | Status: AC
  Administered 2015-02-27: 500 mL via INTRAVENOUS

## 2015-02-27 NOTE — ED Provider Notes (Signed)
[facility] Emergency Department Provider Note  ____________________________________________  Time seen: Approximately 12:55 AM  I have reviewed the triage vital signs and the nursing notes.   HISTORY  Chief Complaint Dizziness and Neck Pain  The patient is a vague historian  HPI [name] is a 80 y.o. male with a past medical history that includes hypertension, hyperlipidemia, and sleep apnea presents with some lightheadedness starting this morning.  His daughters also state that his face was red.  He was having some neck pain earlier today as well.  He has not had any falls and denies any loss of consciousness.  He had a subjective fever earlier today but is afebrile in the emergency department.  He states he had a headache earlier but it is resolved.  He ambulated to the room with no difficulty.  He states that he feels fine and just wanted to "get checked out".  His daughters were worried about him.  They also note that he has a few red spots on his arm and on his back.  The symptoms were gradual in onset, intermittent but persistent, and mild in severity.  Nothing made it better and nothing made it worse.   Past Medical History  Diagnosis Date  . Hypertension   . Sleep apnea     There are no active problems to display for this patient.   Past Surgical History  Procedure Laterality Date  . Joint replacement      total left knee replacement    No current outpatient prescriptions on file.  Allergies Review of patient's allergies indicates no known allergies.  No family history on file.  Social History Social History  Substance Use Topics  . Smoking status: Never Smoker   . Smokeless tobacco: Not on file  . Alcohol Use: No    Review of Systems Constitutional: Subjective fever earlier today Eyes: No visual changes. ENT: No sore throat. Cardiovascular: Denies chest pain. Respiratory: Denies shortness of breath. Gastrointestinal: No  abdominal pain.  No nausea, no vomiting.  No diarrhea.  No constipation. Genitourinary: Negative for dysuria. Musculoskeletal: Negative for back pain. Skin: Red spots on his left arm and on his back. Neurological: Mild headache and neck pain earlier today, now improved.  10-point ROS otherwise negative.  ____________________________________________   PHYSICAL EXAM:  VITAL SIGNS: ED Triage Vitals  Enc Vitals Group     BP 02/26/15 2202 157/106 mmHg     Pulse Rate 02/26/15 2202 85     Resp 02/26/15 2202 17     Temp 02/26/15 2202 97.5 F (36.4 C)     Temp Source 02/26/15 2202 Oral     SpO2 02/26/15 2202 98 %     Weight 02/26/15 2202 197 lb (89.359 kg)     Height 02/26/15 2202 6' (1.829 m)     Head Cir --      Peak Flow --      Pain Score 02/26/15 2203 8     Pain Loc --      Pain Edu? --      Excl. in [place]? --     Constitutional: Alert and oriented. Well appearing and in no acute distress.  Laughing and joking with me. Eyes: Conjunctivae are minimally injected. PERRL. EOMI.  Head: Atraumatic. Nose: No congestion/rhinnorhea. Mouth/Throat: Mucous membranes are moist.  Oropharynx non-erythematous. Neck: No stridor.  No cervical spine tenderness to palpation.  No meningismus and no pain or tenderness to rotation or flexion and extension  Cardiovascular:  Normal rate, regular rhythm. Grossly normal heart sounds.  Good peripheral circulation. Respiratory: Normal respiratory effort.  No retractions. Lungs CTAB. Gastrointestinal: Soft and nontender. No distention. No abdominal bruits. No CVA tenderness. Musculoskeletal: No lower extremity tenderness nor edema.  No joint effusions. Neurologic:  Normal speech and language. No gross focal neurologic deficits are appreciated.  Skin:  Skin is warm, dry and intact.  There are a few erythematous papules on his left arm and on his back that appear most consistent with insect bites.  There is no sign of cellulitis.  The rash is not consistent with  an erythema nodosum, erythema migrans, or RMSF.  The patient also reports that he has not had any recent tick bites. Psychiatric: Mood and affect are normal. Speech and behavior are normal.  ____________________________________________   LABS (all labs ordered are listed, but only abnormal results are displayed)  Labs Reviewed  COMPREHENSIVE METABOLIC PANEL - Abnormal; Notable for the following:    Albumin 3.1 (*)    AST 14 (*)    ALT 9 (*)    All other components within normal limits  CBC WITH DIFFERENTIAL/PLATELET  TROPONIN I   ____________________________________________  EKG  ED ECG REPORT I, Aprile Dickenson, the attending physician, personally viewed and interpreted this ECG.  Date: 02/27/2015 EKG Time: 01:42 Rate: 72 Rhythm: normal sinus rhythm with sinus arrhythmia QRS Axis: normal Intervals: normal ST/T Wave abnormalities: normal Conduction Disutrbances: none Narrative Interpretation: unremarkable  ____________________________________________  RADIOLOGY   No results found.  ____________________________________________   PROCEDURES  Procedure(s) performed: None  Critical Care performed: No ____________________________________________   INITIAL IMPRESSION / ASSESSMENT AND PLAN / ED COURSE  Pertinent labs & imaging results that were available during my care of the patient were reviewed by me and considered in my medical decision making (see chart for details).  The patient is somewhat vague in his history and his daughters appear more concerned than he does.  He states that he feels fine and nothing is bothering him currently.  He was outside a lot yesterday in the woods and likely had some bug bites at that time.  He is laughing and joking with me and completely well appearing.  Given his family's concern, I will do a general screening exam for general malaise but his vital signs are stable and he is afebrile.  I suspect mild volume depletion.  I will give  him a small fluid bolus, check basic labs, check an EKG.  There is no indication for imaging at this time.  He has no signs of SAH, subdurals, meningitis, or other head/neck emergent medical conditions.  (Note that documentation was delayed due to multiple ED patients requiring immediate care.)   The patient feels well after a fluid bolus and his labs were all unremarkable.  He is ready to go home and his family agrees with the plan.  I gave my usual return precautions. ____________________________________________  FINAL CLINICAL IMPRESSION(S) / ED DIAGNOSES  Final diagnoses:  Volume depletion      NEW MEDICATIONS STARTED DURING THIS VISIT:  There are no discharge medications for this patient.    Hinda Kehr, MD 02/27/15 2256

## 2015-02-27 NOTE — Discharge Instructions (Signed)
As we discussed, your workup today was reassuring.  Though we do not know exactly what is causing your symptoms, it appears that you have no emergent medical condition at this time are safe to go home and follow up as recommended in this paperwork.  Drink plenty of clear fluids.  Please return immediately to the Emergency Department if you develop any new or worsening symptoms that concern you.    Dehydration Dehydration means your body does not have as much fluid as it needs. Your kidneys, brain, and heart will not work properly without the right amount of fluids and salt. Older adults are more likely to become dehydrated than younger adults. This is because:   Their bodies do not hold water as well.  Their bodies do not respond to temperature changes as well.  They do not get thirsty as easily or as quickly. HOME CARE  Ask your doctor how to replace body fluid losses (rehydrate).  Drink enough fluids to keep your pee (urine) clear or pale yellow.  Drink small amounts of fluids often if you feel sick to your stomach (nauseous) or throw up (vomit).  Eat like you normally do.  Avoid:  Foods or drinks high in sugar.  Bubbly (carbonated) drinks.  Juice.  Very hot or cold fluids.  Drinks with caffeine.  Fatty, greasy foods.  Alcohol.  Tobacco.  Eating too much.  Gelatin desserts.  Wash your hands to avoid spreading germs (bacteria, viruses).  Only take medicine as told by your doctor.  Keep all doctor visits as told. GET HELP IF:  You have belly (abdominal) pain that gets worse or stays in one spot (localizes).  You have a rash, stiff neck, or bad headache.  You get easily annoyed, sleepy, or are hard to wake up.  You feel weak, dizzy, or very thirsty.  You have a fever. GET HELP RIGHT AWAY IF:   You cannot drink fluid without throwing up.  You get worse even with treatment.  You throw up often.  You have watery poop (diarrhea) often.  Your vomit  has blood in it or looks greenish.  Your poop (stool) has blood in it or looks black and tarry.  You have not peed in 6 to 8 hours or have only peed a small amount of very dark pee.  You pass out (faint). MAKE SURE YOU:   Understand these instructions.  Will watch your condition.  Will get help right away if you are not doing well or get worse.   This information is not intended to replace advice given to you by your health care provider. Make sure you discuss any questions you have with your health care provider.   Document Released: 04/13/2011 Document Revised: 04/29/2013 Document Reviewed: 12/30/2012 Elsevier Interactive Patient Education [employer].

## 2015-04-05 ENCOUNTER — Other Ambulatory Visit: Payer: Self-pay | Admitting: Infectious Diseases

## 2015-04-05 ENCOUNTER — Ambulatory Visit
Admission: RE | Admit: 2015-04-05 | Discharge: 2015-04-05 | Disposition: A | Discharge status: Home / Self Care | Payer: Medicare Other | Source: Ambulatory Visit | Attending: Infectious Diseases | Admitting: Infectious Diseases

## 2015-04-05 DIAGNOSIS — M7122 Synovial cyst of popliteal space [Baker], left knee: Secondary | ICD-10-CM | POA: Insufficient documentation

## 2015-04-05 DIAGNOSIS — R600 Localized edema: Secondary | ICD-10-CM

## 2015-04-05 DIAGNOSIS — R938 Abnormal findings on diagnostic imaging of other specified body structures: Secondary | ICD-10-CM | POA: Insufficient documentation

## 2015-04-05 DIAGNOSIS — M7989 Other specified soft tissue disorders: Secondary | ICD-10-CM | POA: Insufficient documentation

## 2015-08-10 ENCOUNTER — Other Ambulatory Visit: Payer: Self-pay | Admitting: Urology

## 2015-08-10 DIAGNOSIS — C61 Malignant neoplasm of prostate: Secondary | ICD-10-CM

## 2015-08-17 ENCOUNTER — Encounter
Admission: RE | Admit: 2015-08-17 | Discharge: 2015-08-17 | Disposition: A | Discharge status: Home / Self Care | Payer: Medicare Other | Source: Ambulatory Visit | Attending: Urology | Admitting: Urology

## 2015-08-17 DIAGNOSIS — C61 Malignant neoplasm of prostate: Secondary | ICD-10-CM | POA: Diagnosis not present

## 2015-08-17 MED ORDER — TECHNETIUM TC 99M MEDRONATE IV KIT
25.0000 | PACK | Freq: Once | INTRAVENOUS | Status: AC | PRN
  Administered 2015-08-17: 23.29 via INTRAVENOUS

## 2015-08-20 ENCOUNTER — Ambulatory Visit
Admission: RE | Admit: 2015-08-20 | Discharge: 2015-08-20 | Disposition: A | Discharge status: Home / Self Care | Payer: Medicare Other | Source: Ambulatory Visit | Attending: Urology | Admitting: Urology

## 2015-08-20 DIAGNOSIS — N2889 Other specified disorders of kidney and ureter: Secondary | ICD-10-CM | POA: Insufficient documentation

## 2015-08-20 DIAGNOSIS — I700 Atherosclerosis of aorta: Secondary | ICD-10-CM | POA: Diagnosis not present

## 2015-08-20 DIAGNOSIS — C61 Malignant neoplasm of prostate: Secondary | ICD-10-CM | POA: Diagnosis present

## 2015-08-20 DIAGNOSIS — K769 Liver disease, unspecified: Secondary | ICD-10-CM | POA: Insufficient documentation

## 2015-08-20 LAB — POCT I-STAT CREATININE: Creatinine, Ser: 1 mg/dL (ref 0.61–1.24)

## 2015-08-20 MED ORDER — IOPAMIDOL (ISOVUE-300) INJECTION 61%
100.0000 mL | Freq: Once | INTRAVENOUS | Status: AC | PRN
  Administered 2015-08-20: 100 mL via INTRAVENOUS

## 2016-11-15 ENCOUNTER — Other Ambulatory Visit: Payer: Self-pay | Admitting: Orthopedic Surgery

## 2016-11-15 DIAGNOSIS — M25562 Pain in left knee: Secondary | ICD-10-CM

## 2016-11-22 ENCOUNTER — Encounter
Admission: RE | Admit: 2016-11-22 | Discharge: 2016-11-22 | Disposition: A | Discharge status: Home / Self Care | Payer: Medicare Other | Source: Ambulatory Visit | Attending: Orthopedic Surgery | Admitting: Orthopedic Surgery

## 2016-11-22 DIAGNOSIS — M25562 Pain in left knee: Secondary | ICD-10-CM

## 2016-11-22 MED ORDER — TECHNETIUM TC 99M MEDRONATE IV KIT
25.0000 | PACK | Freq: Once | INTRAVENOUS | Status: AC | PRN
  Administered 2016-11-22: 23.01 via INTRAVENOUS

## 2017-01-30 ENCOUNTER — Other Ambulatory Visit: Payer: Self-pay | Admitting: Infectious Diseases

## 2017-01-30 ENCOUNTER — Ambulatory Visit
Admission: RE | Admit: 2017-01-30 | Discharge: 2017-01-30 | Disposition: A | Discharge status: Home / Self Care | Payer: Medicare Other | Source: Ambulatory Visit | Attending: Infectious Diseases | Admitting: Infectious Diseases

## 2017-01-30 DIAGNOSIS — M4856XD Collapsed vertebra, not elsewhere classified, lumbar region, subsequent encounter for fracture with routine healing: Secondary | ICD-10-CM | POA: Insufficient documentation

## 2017-01-30 DIAGNOSIS — M48061 Spinal stenosis, lumbar region without neurogenic claudication: Secondary | ICD-10-CM | POA: Diagnosis not present

## 2017-01-30 DIAGNOSIS — M5136 Other intervertebral disc degeneration, lumbar region: Secondary | ICD-10-CM | POA: Diagnosis not present

## 2017-01-30 DIAGNOSIS — M6250 Muscle wasting and atrophy, not elsewhere classified, unspecified site: Secondary | ICD-10-CM | POA: Diagnosis not present

## 2017-03-21 ENCOUNTER — Other Ambulatory Visit
Admission: RE | Admit: 2017-03-21 | Discharge: 2017-03-21 | Disposition: A | Discharge status: Home / Self Care | Payer: Medicare Other | Source: Ambulatory Visit | Attending: Infectious Diseases | Admitting: Infectious Diseases

## 2017-03-21 DIAGNOSIS — L02416 Cutaneous abscess of left lower limb: Secondary | ICD-10-CM | POA: Diagnosis present

## 2017-03-21 LAB — SYNOVIAL CELL COUNT + DIFF, W/ CRYSTALS: CRYSTALS FLUID: NONE SEEN

## 2017-03-25 LAB — BODY FLUID CULTURE: CULTURE: NO GROWTH

## 2017-12-03 ENCOUNTER — Emergency Department: Payer: Medicare Other

## 2017-12-03 ENCOUNTER — Encounter: Payer: Self-pay | Admitting: Emergency Medicine

## 2017-12-03 ENCOUNTER — Emergency Department
Admission: EM | Admit: 2017-12-03 | Discharge: 2017-12-03 | Disposition: A | Discharge status: Home / Self Care | Payer: Medicare Other | Attending: Emergency Medicine | Admitting: Emergency Medicine

## 2017-12-03 ENCOUNTER — Other Ambulatory Visit: Payer: Self-pay

## 2017-12-03 DIAGNOSIS — Z96652 Presence of left artificial knee joint: Secondary | ICD-10-CM | POA: Insufficient documentation

## 2017-12-03 DIAGNOSIS — I10 Essential (primary) hypertension: Secondary | ICD-10-CM | POA: Diagnosis not present

## 2017-12-03 DIAGNOSIS — Z8546 Personal history of malignant neoplasm of prostate: Secondary | ICD-10-CM | POA: Diagnosis not present

## 2017-12-03 DIAGNOSIS — R079 Chest pain, unspecified: Secondary | ICD-10-CM | POA: Diagnosis present

## 2017-12-03 DIAGNOSIS — R0789 Other chest pain: Secondary | ICD-10-CM | POA: Insufficient documentation

## 2017-12-03 DIAGNOSIS — R091 Pleurisy: Secondary | ICD-10-CM

## 2017-12-03 LAB — BASIC METABOLIC PANEL
ANION GAP: 5 (ref 5–15)
BUN: 24 mg/dL — ABNORMAL HIGH (ref 8–23)
CHLORIDE: 100 mmol/L (ref 98–111)
CO2: 36 mmol/L — AB (ref 22–32)
Calcium: 9.3 mg/dL (ref 8.9–10.3)
Creatinine, Ser: 0.95 mg/dL (ref 0.61–1.24)
GFR calc Af Amer: >60 mL/min (ref >60)
GFR calc non Af Amer: >60 mL/min (ref >60)
GLUCOSE: 107 mg/dL — AB (ref 70–99)
POTASSIUM: 4.4 mmol/L (ref 3.5–5.1)
Sodium: 141 mmol/L (ref 135–145)

## 2017-12-03 LAB — FIBRIN DERIVATIVES D-DIMER (ARMC ONLY): Fibrin derivatives D-dimer (ARMC): 1063.05 ng/mL (FEU) — ABNORMAL HIGH (ref 0.00–499.00)

## 2017-12-03 LAB — CBC
HEMATOCRIT: 43.8 % (ref 40.0–52.0)
Hemoglobin: 14.7 g/dL (ref 13.0–18.0)
MCH: 29.9 pg (ref 26.0–34.0)
MCHC: 33.5 g/dL (ref 32.0–36.0)
MCV: 89.3 fL (ref 80.0–100.0)
Platelets: 200 10*3/uL (ref 150–440)
RBC: 4.91 MIL/uL (ref 4.40–5.90)
RDW: 14 % (ref 11.5–14.5)
WBC: 7.6 10*3/uL (ref 3.8–10.6)

## 2017-12-03 LAB — TROPONIN I
Troponin I: <0.03 ng/mL (ref <0.03)
Troponin I: <0.03 ng/mL (ref <0.03)
Troponin I: <0.03 ng/mL (ref <0.03)

## 2017-12-03 MED ORDER — IOPAMIDOL (ISOVUE-370) INJECTION 76%
75.0000 mL | Freq: Once | INTRAVENOUS | Status: AC | PRN
  Administered 2017-12-03: 75 mL via INTRAVENOUS

## 2017-12-03 MED ORDER — ACETAMINOPHEN 500 MG PO TABS
1000.0000 mg | ORAL_TABLET | Freq: Once | ORAL | Status: AC
  Administered 2017-12-03: 1000 mg via ORAL
  Filled 2017-12-03: qty 2

## 2017-12-03 NOTE — Discharge Instructions (Addendum)
Pain: take Tylenol 2 extra strength three times a day as needed for pain.  Follow-up with your doctor in 2 days.  Return to the emergency room for new or worsening chest pain, shortness of breath or fever.

## 2017-12-03 NOTE — ED Provider Notes (Signed)
[facility] Emergency Department Provider Note  ____________________________________________  Time seen: Approximately 7:03 PM  I have reviewed the triage vital signs and the nursing notes.   HISTORY  Chief Complaint Chest Pain   HPI [name] is a 83 y.o. male with a history of hypertension, sleep apnea, atrial fibrillation not on anticoagulation, remote prostate cancer, hyperlipidemia who presents for evaluation of chest pain.  Patient reports that the pain started this morning while he was resting.  The pain is dull, located in the left side of his chest, constant and nonradiating and worse when he takes a deep breath or coughs.  The pain is 6 out of 10.  Patient reports having this pain in the past but that resolved without intervention.  No cough or fever, no personal or family history of blood clots, no recent travel immobilization, no leg pain or swelling, no hemoptysis, no exogenous hormones.  No history of ischemic heart disease.  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 JOINT REPLACEMENT     total left knee replacement     Allergies Sulfa antibiotics  FH None - reviewed  Social  History Social History   Tobacco Use  . Smoking status: Never Smoker  . Smokeless tobacco: Never Used  Substance Use Topics  . Alcohol use: No  . Drug use: No    Review of Systems  Constitutional: Negative for fever. Eyes: Negative for visual changes. ENT: Negative for sore throat. Neck: No neck pain  Cardiovascular: + L sided chest pain. Respiratory: Negative for shortness of breath. Gastrointestinal: Negative for abdominal pain, vomiting or diarrhea. Genitourinary: Negative for dysuria. Musculoskeletal: Negative for back pain. Skin: Negative for rash. Neurological: Negative for headaches, weakness or numbness. Psych: No SI or HI  ____________________________________________   PHYSICAL EXAM:  VITAL SIGNS: ED Triage Vitals  Enc Vitals Group     BP 12/03/17 1758 (!) 142/91     Pulse Rate 12/03/17 1758 80     Resp 12/03/17 1758 16     Temp 12/03/17 1758 (!) 97.5 F (36.4 C)     Temp Source 12/03/17 1758 Oral     SpO2 12/03/17 1758 98 %     Weight 12/03/17 1759 197 lb (89.4 kg)     Height 12/03/17 1759 6' (1.829 m)     Head Circumference --      Peak Flow --      Pain Score 12/03/17 1759 10     Pain Loc --      Pain Edu? --      Excl. in [place]? --     Constitutional: Alert and oriented. Well appearing and in no apparent distress. HEENT:      Head: Normocephalic and atraumatic.         Eyes: Conjunctivae are normal. Sclera  is non-icteric.       Mouth/Throat: Mucous membranes are moist.       Neck: Supple with no signs of meningismus. Cardiovascular: Regular rate and rhythm. No murmurs, gallops, or rubs. 2+ symmetrical distal pulses are present in all extremities. No JVD. Respiratory: Normal respiratory effort. Lungs are clear to auscultation bilaterally. No wheezes, crackles, or rhonchi.  Gastrointestinal: Soft, non tender, and non distended with positive bowel sounds. No rebound or guarding. Musculoskeletal: Asymmetric swelling of LLE Neurologic: Normal speech  and language. Face is symmetric. Moving all extremities. No gross focal neurologic deficits are appreciated. Skin: Skin is warm, dry and intact. No rash noted. Psychiatric: Mood and affect are normal. Speech and behavior are normal.  ____________________________________________   LABS (all labs ordered are listed, but only abnormal results are displayed)  Labs Reviewed  BASIC METABOLIC PANEL - Abnormal; Notable for the following components:      Result Value   CO2 36 (*)    Glucose, Bld 107 (*)    BUN 24 (*)    All other components within normal limits  FIBRIN DERIVATIVES D-DIMER ([facility] ONLY) - Abnormal; Notable for the following components:   Fibrin derivatives D-dimer ([facility]) 1,063.05 (*)    All other components within normal limits  CBC  TROPONIN I  TROPONIN I  TROPONIN I   ____________________________________________  EKG  ED ECG REPORT I, Rudene Re, the attending physician, personally viewed and interpreted this ECG.  NSR, rate of 67, normal QTC, normal axis, rare PVCs and PACs, no ST elevations or depressions.  Unchanged from prior. ____________________________________________  RADIOLOGY  I have personally reviewed the images performed during this visit and I agree with the Radiologist's read.   Interpretation by Radiologist:  Dg Chest 2 View  Result Date: 12/03/2017 CLINICAL DATA:  Chest pain with breathing since this morning, pain with coughing, shortness of breath, hypertension, history sleep apnea EXAM: CHEST - 2 VIEW COMPARISON:  10/11/2010 FINDINGS: Normal heart size and pulmonary vascularity. Tortuous thoracic aorta with atherosclerotic calcification. RIGHT basilar subsegmental atelectasis. Lungs otherwise clear. No pulmonary infiltrate, pleural effusion or pneumothorax. Scattered endplate spur formation thoracic spine. IMPRESSION: Subsegmental atelectasis RIGHT base. Electronically Signed   By: Lavonia Dana M.D.   On: 12/03/2017 18:42   Ct Angio Chest Pe  W And/or Wo Contrast  Result Date: 12/03/2017 CLINICAL DATA:  Chest pain, cough, shortness of breath EXAM: CT ANGIOGRAPHY CHEST WITH CONTRAST TECHNIQUE: Multidetector CT imaging of the chest was performed using the standard protocol during bolus administration of intravenous contrast. Multiplanar CT image reconstructions and MIPs were obtained to evaluate the vascular anatomy. CONTRAST:  28mL ISOVUE-370 IOPAMIDOL (ISOVUE-370) INJECTION 76% COMPARISON:  Chest radiographs dated 12/03/2017 FINDINGS: Cardiovascular: Satisfactory opacification the bilateral pulmonary arteries to the segmental level. No evidence of pulmonary embolism. No evidence of thoracic aortic aneurysm. Atherosclerotic calcifications of the aortic arch. The heart is normal in size.  No pericardial effusion. Coronary atherosclerosis of the LAD. Mediastinum/Nodes: No suspicious mediastinal or axillary lymphadenopathy. Visualized thyroid is grossly unremarkable. Lungs/Pleura: Right lower lobe scarring/atelectasis. Additional atelectasis in the inferior right middle lobe and left lower lobe. No suspicious pulmonary nodules. No focal consolidation. No pleural effusion or pneumothorax. Upper Abdomen: Visualized upper abdomen is notable for vascular calcifications and right renal cysts measuring up to 1.9 cm (series 5/image 95), incompletely visualized. Musculoskeletal: Degenerative changes of the visualized thoracolumbar spine. Review of the MIP images confirms the above findings. IMPRESSION: No evidence of pulmonary embolism. No evidence of acute cardiopulmonary disease.  Aortic Atherosclerosis (ICD10-I70.0). Electronically Signed   By: Julian Hy M.D.   On: 12/03/2017 21:35    ____________________________________________   PROCEDURES  Procedure(s) performed: None Procedures Critical Care performed:  None ____________________________________________   INITIAL IMPRESSION / ASSESSMENT AND PLAN / ED COURSE  83 y.o. male with a history  of hypertension, sleep apnea, atrial fibrillation not on anticoagulation, remote prostate cancer, hyperlipidemia who presents for evaluation of constant moderate dull L sided chest pain worse with inspiration since this am.  Patient is well-appearing, no distress, has normal vital signs, normal work of breathing, lungs are clear, heart regular rate and rhythm.  EKG showing normal sinus rhythm with no evidence of ischemia.  Chest x-ray showed no evidence of pneumonia or pneumothorax.  Patient has normal cardiac silhouette, normal blood pressure, no neurological deficits, pain not radiating to the back therefore low suspicion for dissection.  Patient low risk for PE we will send a d-dimer.  Also the differential diagnosis is pleurisy.  Will give Tylenol and reassess.  Clinical Course as of Dec 04 2206  Mon Dec 03, 2017  2207 D-dimer was elevated therefore patient was sent for a CTA to rule out a PE which is negative.  Serial troponins are negative.  Patient reports the pain resolved with Tylenol.  Most likely pleurisy.  Recommended follow-up with primary care doctor in 2 days, Tylenol 1000 mg 3 times daily as needed.  Discussed return precautions for new or worsening chest pain, shortness of breath or fever.   [CV]    Clinical Course User Index [CV] Alfred Levins [place], MD     As part of my medical decision making, I reviewed the following data within the [place] History obtained from family, Nursing notes reviewed and incorporated, Labs reviewed , EKG interpreted , Old EKG reviewed, Old chart reviewed, Radiograph reviewed  Notes from prior ED visits and [place] Controlled Substance Database    Pertinent labs & imaging results that were available during my care of the patient were reviewed by me and considered in my medical decision making (see chart for details).    ____________________________________________   FINAL CLINICAL IMPRESSION(S) / ED DIAGNOSES  Final diagnoses:    None      NEW MEDICATIONS STARTED DURING THIS VISIT:  ED Discharge Orders    None       Note:  This document was prepared using Dragon voice recognition software and may include unintentional dictation errors.    Rudene Re, MD 12/03/17 [phone]

## 2017-12-03 NOTE — ED Triage Notes (Signed)
Pt presents with chest pain "when I breathe" since this morning.  Denies any hx of heart problems. Pt states it hurts when he coughs as well. No radiation. States he has also has sob, which is not uncommon for pt. Pt alert & oriented with nad noted.

## 2017-12-03 NOTE — ED Notes (Signed)
Writer and Family helped walk pt to toilet in room. Pt is now back in bed and on monitor.

## 2017-12-06 ENCOUNTER — Other Ambulatory Visit: Payer: Self-pay

## 2017-12-06 ENCOUNTER — Emergency Department: Payer: Medicare Other

## 2017-12-06 DIAGNOSIS — I10 Essential (primary) hypertension: Secondary | ICD-10-CM | POA: Insufficient documentation

## 2017-12-06 DIAGNOSIS — R0602 Shortness of breath: Secondary | ICD-10-CM | POA: Diagnosis not present

## 2017-12-06 DIAGNOSIS — Z79899 Other long term (current) drug therapy: Secondary | ICD-10-CM | POA: Diagnosis not present

## 2017-12-06 DIAGNOSIS — R0789 Other chest pain: Secondary | ICD-10-CM | POA: Insufficient documentation

## 2017-12-06 NOTE — ED Triage Notes (Addendum)
Pt arrives to ED via POV from home with headache and SHOB since Wednesday. Pt reports non-radiating left-sided chest pain. Pt denies N/V/D or fever. Pt recently seen for same, but returns d/t unresolved s/x's.  Pt's family also requesting pt be checked for a "urinary infection".

## 2017-12-07 ENCOUNTER — Emergency Department
Admission: EM | Admit: 2017-12-07 | Discharge: 2017-12-07 | Disposition: A | Discharge status: Home / Self Care | Payer: Medicare Other | Attending: Emergency Medicine | Admitting: Emergency Medicine

## 2017-12-07 DIAGNOSIS — R0781 Pleurodynia: Secondary | ICD-10-CM

## 2017-12-07 DIAGNOSIS — R0602 Shortness of breath: Secondary | ICD-10-CM

## 2017-12-07 LAB — BASIC METABOLIC PANEL
Anion gap: 7 (ref 5–15)
BUN: 14 mg/dL (ref 8–23)
CO2: 34 mmol/L — ABNORMAL HIGH (ref 22–32)
CREATININE: 0.8 mg/dL (ref 0.61–1.24)
Calcium: 9.2 mg/dL (ref 8.9–10.3)
Chloride: 96 mmol/L — ABNORMAL LOW (ref 98–111)
GFR calc Af Amer: >60 mL/min (ref >60)
Glucose, Bld: 121 mg/dL — ABNORMAL HIGH (ref 70–99)
POTASSIUM: 3.6 mmol/L (ref 3.5–5.1)
SODIUM: 137 mmol/L (ref 135–145)

## 2017-12-07 LAB — CBC
HCT: 47.2 % (ref 40.0–52.0)
Hemoglobin: 16.1 g/dL (ref 13.0–18.0)
MCH: 29.9 pg (ref 26.0–34.0)
MCHC: 34 g/dL (ref 32.0–36.0)
MCV: 88 fL (ref 80.0–100.0)
PLATELETS: 204 10*3/uL (ref 150–440)
RBC: 5.36 MIL/uL (ref 4.40–5.90)
RDW: 13.5 % (ref 11.5–14.5)
WBC: 7.9 10*3/uL (ref 3.8–10.6)

## 2017-12-07 LAB — HEPATIC FUNCTION PANEL
ALT: 13 U/L (ref 0–44)
AST: 28 U/L (ref 15–41)
Albumin: 3.5 g/dL (ref 3.5–5.0)
Alkaline Phosphatase: 70 U/L (ref 38–126)
TOTAL PROTEIN: 7.9 g/dL (ref 6.5–8.1)
Total Bilirubin: 0.8 mg/dL (ref 0.3–1.2)

## 2017-12-07 LAB — TROPONIN I: Troponin I: <0.03 ng/mL (ref <0.03)

## 2017-12-07 LAB — LIPASE, BLOOD: LIPASE: 31 U/L (ref 11–51)

## 2017-12-07 LAB — BRAIN NATRIURETIC PEPTIDE: B NATRIURETIC PEPTIDE 5: 193 pg/mL — AB (ref 0.0–100.0)

## 2017-12-07 MED ORDER — LIDOCAINE 5 % EX PTCH
1.0000 | MEDICATED_PATCH | Freq: Once | CUTANEOUS | Status: DC
  Administered 2017-12-07: 1 via TRANSDERMAL
  Filled 2017-12-07: qty 1

## 2017-12-07 MED ORDER — IBUPROFEN 600 MG PO TABS
600.0000 mg | ORAL_TABLET | Freq: Once | ORAL | Status: AC
  Administered 2017-12-07: 600 mg via ORAL
  Filled 2017-12-07: qty 1

## 2017-12-07 NOTE — Discharge Instructions (Signed)
Please make an appointment to follow-up with your Cardiologist Dr. Saralyn Pilar within the next week for a recheck and return to the ED sooner for any concerns whatsoever.  It was a pleasure to take care of you today, and thank you for coming to our emergency department.  If you have any questions or concerns before leaving please ask the nurse to grab me and I'm more than happy to go through your aftercare instructions again.  If you were prescribed any opioid pain medication today such as Norco, Vicodin, Percocet, morphine, hydrocodone, or oxycodone please make sure you do not drive when you are taking this medication as it can alter your ability to drive safely.  If you have any concerns once you are home that you are not improving or are in fact getting worse before you can make it to your follow-up appointment, please do not hesitate to call [phone] and come back for further evaluation.  Darel Hong, MD  Results for orders placed or performed during the hospital encounter of 67/12/45  Basic metabolic panel  Result Value Ref Range   Sodium 137 135 - 145 mmol/L   Potassium 3.6 3.5 - 5.1 mmol/L   Chloride 96 (L) 98 - 111 mmol/L   CO2 34 (H) 22 - 32 mmol/L   Glucose, Bld 121 (H) 70 - 99 mg/dL   BUN 14 8 - 23 mg/dL   Creatinine, Ser 0.80 0.61 - 1.24 mg/dL   Calcium 9.2 8.9 - 10.3 mg/dL   GFR calc non Af Amer >60 >60 mL/min   GFR calc Af Amer >60 >60 mL/min   Anion gap 7 5 - 15  CBC  Result Value Ref Range   WBC 7.9 3.8 - 10.6 K/uL   RBC 5.36 4.40 - 5.90 MIL/uL   Hemoglobin 16.1 13.0 - 18.0 g/dL   HCT 47.2 40.0 - 52.0 %   MCV 88.0 80.0 - 100.0 fL   MCH 29.9 26.0 - 34.0 pg   MCHC 34.0 32.0 - 36.0 g/dL   RDW 13.5 11.5 - 14.5 %   Platelets 204 150 - 440 K/uL  Troponin I  Result Value Ref Range   Troponin I <0.03 <0.03 ng/mL  Brain natriuretic peptide  Result Value Ref Range   B Natriuretic Peptide 193.0 (H) 0.0 - 100.0 pg/mL  Lipase, blood  Result Value Ref Range   Lipase 31 11 -  51 U/L  Hepatic function panel  Result Value Ref Range   Total Protein 7.9 6.5 - 8.1 g/dL   Albumin 3.5 3.5 - 5.0 g/dL   AST 28 15 - 41 U/L   ALT 13 0 - 44 U/L   Alkaline Phosphatase 70 38 - 126 U/L   Total Bilirubin 0.8 0.3 - 1.2 mg/dL   Bilirubin, Direct <0.1 0.0 - 0.2 mg/dL   Indirect Bilirubin NOT CALCULATED 0.3 - 0.9 mg/dL   Dg Chest 2 View  Result Date: 12/07/2017 CLINICAL DATA:  Shortness of breath.  Chest pain. EXAM: CHEST - 2 VIEW COMPARISON:  Radiographs and CT 3 days ago 12/03/2017 FINDINGS: Unchanged heart size and mediastinal contours. Subsegmental atelectasis or scarring in the right lung base. No pulmonary edema, new airspace disease, pleural effusion or pneumothorax. No acute osseous abnormalities. IMPRESSION: Unchanged subsegmental atelectasis or scarring at the right lung base. No new abnormality since radiograph and CT 3 days ago. Electronically Signed   By: Jeb Levering M.D.   On: 12/07/2017 00:39   Dg Chest 2 View  Result Date:  12/03/2017 CLINICAL DATA:  Chest pain with breathing since this morning, pain with coughing, shortness of breath, hypertension, history sleep apnea EXAM: CHEST - 2 VIEW COMPARISON:  10/11/2010 FINDINGS: Normal heart size and pulmonary vascularity. Tortuous thoracic aorta with atherosclerotic calcification. RIGHT basilar subsegmental atelectasis. Lungs otherwise clear. No pulmonary infiltrate, pleural effusion or pneumothorax. Scattered endplate spur formation thoracic spine. IMPRESSION: Subsegmental atelectasis RIGHT base. Electronically Signed   By: Lavonia Dana M.D.   On: 12/03/2017 18:42   Ct Angio Chest Pe W And/or Wo Contrast  Result Date: 12/03/2017 CLINICAL DATA:  Chest pain, cough, shortness of breath EXAM: CT ANGIOGRAPHY CHEST WITH CONTRAST TECHNIQUE: Multidetector CT imaging of the chest was performed using the standard protocol during bolus administration of intravenous contrast. Multiplanar CT image reconstructions and MIPs were  obtained to evaluate the vascular anatomy. CONTRAST:  25mL ISOVUE-370 IOPAMIDOL (ISOVUE-370) INJECTION 76% COMPARISON:  Chest radiographs dated 12/03/2017 FINDINGS: Cardiovascular: Satisfactory opacification the bilateral pulmonary arteries to the segmental level. No evidence of pulmonary embolism. No evidence of thoracic aortic aneurysm. Atherosclerotic calcifications of the aortic arch. The heart is normal in size.  No pericardial effusion. Coronary atherosclerosis of the LAD. Mediastinum/Nodes: No suspicious mediastinal or axillary lymphadenopathy. Visualized thyroid is grossly unremarkable. Lungs/Pleura: Right lower lobe scarring/atelectasis. Additional atelectasis in the inferior right middle lobe and left lower lobe. No suspicious pulmonary nodules. No focal consolidation. No pleural effusion or pneumothorax. Upper Abdomen: Visualized upper abdomen is notable for vascular calcifications and right renal cysts measuring up to 1.9 cm (series 5/image 95), incompletely visualized. Musculoskeletal: Degenerative changes of the visualized thoracolumbar spine. Review of the MIP images confirms the above findings. IMPRESSION: No evidence of pulmonary embolism. No evidence of acute cardiopulmonary disease. Aortic Atherosclerosis (ICD10-I70.0). Electronically Signed   By: Julian Hy M.D.   On: 12/03/2017 21:35

## 2017-12-07 NOTE — ED Notes (Signed)
Pt family member stated that pt was dizzy, light headed, and had left sided chest pain on today. Pt fell yesterday, pt denies hitting his head. Daughter also states that he hasn't been eating full meals or really drinking much fluids since Sunday.

## 2017-12-07 NOTE — ED Provider Notes (Signed)
[facility] Emergency Department Provider Note  ____________________________________________   First MD Initiated Contact with Patient 12/07/17 0149     (approximate)  I have reviewed the triage vital signs and the nursing notes.   HISTORY  Chief Complaint Shortness of Breath and Headache   HPI [name] is a 83 y.o. male self presents to the emergency department with several days of headache and shortness of breath along with sharp left-sided chest pain.  Symptoms began gradually are now sharp moderate severity and worse with deep inspiration.  They are mostly constant.  She was seen in our emergency department 2 days ago and had negative blood work and a normal CT angiogram and was discharged home with a diagnosis of "pleurisy".  She represents today because her pain persists.  She denies abdominal pain nausea or vomiting.  Her symptoms are unchanged from several days ago.  She is also concerned that she could have a urinary tract infection although she denies dysuria frequency hesitancy or back pain.    Past Medical History:  Diagnosis Date  . Hypertension   . Sleep apnea     There are no active problems to display for this patient.   Past Surgical History:  Procedure Laterality Date  . JOINT REPLACEMENT     total left knee replacement    Prior to Admission medications   Medication Sig Start Date End Date Taking? Authorizing Provider  ciprofloxacin (CIPRO) 500 MG tablet Take 1 tablet by mouth 2 (two) times daily. 11/25/17  Yes [provider]  hydrochlorothiazide (HYDRODIURIL) 12.5 MG tablet Take 12.5 mg by mouth daily. 10/03/17  Yes [provider]  simvastatin (ZOCOR) 20 MG tablet Take 1 tablet by mouth at bedtime. 09/19/17  Yes [provider]  tamsulosin (FLOMAX) 0.4 MG CAPS capsule Take 1 capsule by mouth daily. 11/13/17  Yes [provider]  vitamin B-12 (CYANOCOBALAMIN) 1000 MCG tablet Take 1 tablet by  mouth daily. 03/06/14  Yes [provider]    Allergies Sulfa antibiotics  No family history on file.  Social History Social History   Tobacco Use  . Smoking status: Never Smoker  . Smokeless tobacco: Never Used  Substance Use Topics  . Alcohol use: No  . Drug use: No    Review of Systems Constitutional: No fever/chills Eyes: No visual changes. ENT: No sore throat. Cardiovascular: Positive for chest pain. Respiratory: Positive for shortness of breath. Gastrointestinal: No abdominal pain.  No nausea, no vomiting.  No diarrhea.  No constipation. Genitourinary: Negative for dysuria. Musculoskeletal: Negative for back pain. Skin: Negative for rash. Neurological: Positive for headache   ____________________________________________   PHYSICAL EXAM:  VITAL SIGNS: ED Triage Vitals [12/06/17 2346]  Enc Vitals Group     BP      Pulse      Resp      Temp      Temp src      SpO2      Weight 197 lb (89.4 kg)     Height 6' (1.829 m)     Head Circumference      Peak Flow      Pain Score 8     Pain Loc      Pain Edu?      Excl. in [place]?     Constitutional: Alert and oriented x4 pleasant cooperative speaks in full clear sentences although obviously uncomfortable when taking a deep breath Eyes: PERRL EOMI. Head: Atraumatic. Nose: No congestion/rhinnorhea. Mouth/Throat: No  trismus Neck: No stridor.  No meningismus  Cardiovascular: Normal rate, regular rhythm. Grossly normal heart sounds.  Good peripheral circulation.  Chest wall stable no crepitus Respiratory: Normal respiratory effort.  No retractions. Lungs CTAB and moving good air Gastrointestinal: Soft nontender Musculoskeletal: No lower extremity edema   Neurologic:  Normal speech and language. No gross focal neurologic deficits are appreciated. Skin:  Skin is warm, dry and intact. No rash noted.  Specifically no zoster rash Psychiatric: Mood and affect are normal. Speech and behavior are  normal.    ____________________________________________   DIFFERENTIAL includes but not limited to  Zoster, acute coronary syndrome, pulmonary edema, pulmonary embolism ____________________________________________   LABS (all labs ordered are listed, but only abnormal results are displayed)  Labs Reviewed  BASIC METABOLIC PANEL - Abnormal; Notable for the following components:      Result Value   Chloride 96 (*)    CO2 34 (*)    Glucose, Bld 121 (*)    All other components within normal limits  BRAIN NATRIURETIC PEPTIDE - Abnormal; Notable for the following components:   B Natriuretic Peptide 193.0 (*)    All other components within normal limits  CBC  TROPONIN I  LIPASE, BLOOD  HEPATIC FUNCTION PANEL    Lab work reviewed by me with slightly elevated BNP which is nonspecific __________________________________________  EKG  ED ECG REPORT I, Darel Hong, the attending physician, personally viewed and interpreted this ECG.  Date: 12/07/2017 EKG Time:  Rate: 77 Rhythm: Atrial fibrillation QRS Axis: Leftward axis Intervals: normal ST/T Wave abnormalities: normal Narrative Interpretation: no evidence of acute ischemia  ____________________________________________  RADIOLOGY  Chest x-ray reviewed by me with no acute disease ____________________________________________   PROCEDURES  Procedure(s) performed: no  Procedures  Critical Care performed: no  ____________________________________________   INITIAL IMPRESSION / ASSESSMENT AND PLAN / ED COURSE  Pertinent labs & imaging results that were available during my care of the patient were reviewed by me and considered in my medical decision making (see chart for details).   As part of my medical decision making, I reviewed the following data within the [place] History obtained from family if available, nursing notes, old chart and ekg, as well as notes from prior ED visits.  The  patient arrives hemodynamically stable and somewhat uncomfortable although overall well-appearing.  She had a negative CT angiogram 2 days ago.  Repeat lab work today is largely unremarkable aside from perhaps some slight elevation in her BNP.  Given Toradol which seems to have helped her symptoms.  I had a lengthy discussion with the patient regarding the diagnostic uncertainty and the importance of close primary care follow-up.  She verbalizes understanding and agreement with plan.      ____________________________________________   FINAL CLINICAL IMPRESSION(S) / ED DIAGNOSES  Final diagnoses:  Shortness of breath  Pleuritic chest pain      NEW MEDICATIONS STARTED DURING THIS VISIT:  Discharge Medication List as of 12/07/2017  3:15 AM       Note:  This document was prepared using Dragon voice recognition software and may include unintentional dictation errors.     Darel Hong, MD 12/09/17 2153

## 2018-02-02 ENCOUNTER — Emergency Department
Admission: EM | Admit: 2018-02-02 | Discharge: 2018-02-02 | Disposition: A | Discharge status: Home / Self Care | Payer: Medicare Other | Attending: Emergency Medicine | Admitting: Emergency Medicine

## 2018-02-02 ENCOUNTER — Other Ambulatory Visit: Payer: Self-pay

## 2018-02-02 ENCOUNTER — Encounter: Payer: Self-pay | Admitting: Emergency Medicine

## 2018-02-02 ENCOUNTER — Emergency Department: Payer: Medicare Other

## 2018-02-02 DIAGNOSIS — W19XXXA Unspecified fall, initial encounter: Secondary | ICD-10-CM

## 2018-02-02 DIAGNOSIS — M542 Cervicalgia: Secondary | ICD-10-CM | POA: Diagnosis not present

## 2018-02-02 DIAGNOSIS — S0001XA Abrasion of scalp, initial encounter: Secondary | ICD-10-CM | POA: Diagnosis not present

## 2018-02-02 DIAGNOSIS — M79641 Pain in right hand: Secondary | ICD-10-CM | POA: Diagnosis not present

## 2018-02-02 DIAGNOSIS — Y998 Other external cause status: Secondary | ICD-10-CM | POA: Insufficient documentation

## 2018-02-02 DIAGNOSIS — Y92093 Driveway of other non-institutional residence as the place of occurrence of the external cause: Secondary | ICD-10-CM | POA: Diagnosis not present

## 2018-02-02 DIAGNOSIS — I10 Essential (primary) hypertension: Secondary | ICD-10-CM | POA: Diagnosis not present

## 2018-02-02 DIAGNOSIS — S0081XA Abrasion of other part of head, initial encounter: Secondary | ICD-10-CM | POA: Diagnosis not present

## 2018-02-02 DIAGNOSIS — Z79899 Other long term (current) drug therapy: Secondary | ICD-10-CM | POA: Diagnosis not present

## 2018-02-02 DIAGNOSIS — Y9389 Activity, other specified: Secondary | ICD-10-CM | POA: Insufficient documentation

## 2018-02-02 DIAGNOSIS — Z96652 Presence of left artificial knee joint: Secondary | ICD-10-CM | POA: Diagnosis not present

## 2018-02-02 DIAGNOSIS — S0990XA Unspecified injury of head, initial encounter: Secondary | ICD-10-CM | POA: Diagnosis present

## 2018-02-02 DIAGNOSIS — W01198A Fall on same level from slipping, tripping and stumbling with subsequent striking against other object, initial encounter: Secondary | ICD-10-CM | POA: Diagnosis not present

## 2018-02-02 MED ORDER — MUPIROCIN 2 % EX OINT: TOPICAL_OINTMENT | CUTANEOUS | 0 refills | Status: AC

## 2018-02-02 NOTE — ED Provider Notes (Signed)
[facility] Emergency Department Provider Note  ____________________________________________  Time seen: Approximately 2:57 PM  I have reviewed the triage vital signs and the nursing notes.   HISTORY  Chief Complaint Fall    HPI [name] is a 83 y.o. male with a history of hypertension, presents to the emergency department after a fall  that occurred at approximately 12 AM last night.  Patient was walking to the driveway to start his daughter's car when he tripped and fell from standing height.  Patient sustained abrasions to scalp and forehead.  He denied loss of consciousness but event was not witnessed.  Patient has has no new blurry vision, rhinorrhea, nausea, vomiting or perceived disorientation or confusion by daughters.  Patient is not currently taking blood thinners.  No prior history of traumatic brain injury.  Patient does have some mild neck and right hand discomfort but no subjective weakness or numbness/ tingling in the upper or lower extremities.  No chest pain, chest tightness or shortness of breath.  Patient resides with one of his daughters and has strong social support at home.   Past Medical History:  Diagnosis Date  . Hypertension   . Sleep apnea     There are no active problems to display for this patient.   Past Surgical History:  Procedure Laterality Date  . JOINT REPLACEMENT     total left knee replacement    Prior to Admission medications   Medication Sig Start Date End Date Taking? Authorizing Provider  ciprofloxacin (CIPRO) 500 MG tablet Take 1 tablet by mouth 2 (two) times daily. 11/25/17   [provider]  hydrochlorothiazide (HYDRODIURIL) 12.5 MG tablet Take 12.5 mg by mouth daily. 10/03/17   [provider]  mupirocin ointment (BACTROBAN) 2 % Apply to affected area 3 times daily 02/02/18 02/02/19  Lannie Fields, PA-C  simvastatin (ZOCOR) 20 MG tablet Take 1 tablet by mouth at bedtime. 09/19/17    [provider]  tamsulosin (FLOMAX) 0.4 MG CAPS capsule Take 1 capsule by mouth daily. 11/13/17   [provider]  vitamin B-12 (CYANOCOBALAMIN) 1000 MCG tablet Take 1 tablet by mouth daily. 03/06/14   [provider]    Allergies Sulfa antibiotics  History reviewed. No pertinent family history.  Social History Social History   Tobacco Use  . Smoking status: Never Smoker  . Smokeless tobacco: Never Used  Substance Use Topics  . Alcohol use: No  . Drug use: No     Review of Systems  Constitutional: No fever/chills Eyes: No visual changes. No discharge ENT: No upper respiratory complaints. Cardiovascular: no chest pain. Respiratory: no cough. No SOB. Gastrointestinal: No abdominal pain.  No nausea, no vomiting.  No diarrhea.  No constipation. Genitourinary: Negative for dysuria. No hematuria Musculoskeletal: Negative for musculoskeletal pain. Skin: Patient has abrasions.  Neurological: Patient has headache, focal weakness or numbness.   ____________________________________________   PHYSICAL EXAM:  VITAL SIGNS: ED Triage Vitals  Enc Vitals Group     BP 02/02/18 1423 (!) 144/66     Pulse Rate 02/02/18 1423 72     Resp 02/02/18 1423 18     Temp 02/02/18 1423 (!) 97.5 F (36.4 C)     Temp Source 02/02/18 1423 Oral     SpO2 02/02/18 1423 98 %     Weight 02/02/18 1425 207 lb (93.9 kg)     Height 02/02/18 1425 6' (1.829 m)     Head Circumference --  Peak Flow --      Pain Score 02/02/18 1423 6     Pain Loc --      Pain Edu? --      Excl. in [place]? --      Constitutional: Alert and oriented x 3.  Patient appears relaxed. Eyes: Positive raccoon sign bilaterally. No subconjunctival hemorrhage. PERRL. EOMI.  No nystagmus. Head:  Palpable frontal scalp hematoma. ENT:      Ears: TMs are pearly without hemorrhagic effusion.  There is no ecchymosis behind the pinna bilaterally.      Nose: No rhinorrhea      Mouth/Throat: Mucous membranes  are moist.  Neck: Full range of motion with no midline C-spine tenderness to palpation. Cardiovascular: Normal rate, regular rhythm. Normal S1 and S2.  Good peripheral circulation. Respiratory: Normal respiratory effort without tachypnea or retractions. Lungs CTAB. Good air entry to the bases with no decreased or absent breath sounds. Gastrointestinal: Bowel sounds 4 quadrants. Soft and nontender to palpation. No guarding or rigidity. No palpable masses. No distention. No CVA tenderness. Musculoskeletal: Patient has 5 out of 5 strength in the upper and lower extremities bilaterally.  He is able to perform full range of motion in the upper and lower extremities symmetrically. Neurologic:  Normal speech and language.  Cranial nerves II through XII are intact.  No hypo-or hyperreflexia.  Patient is observed ambulating with slight flexion at the spine with no limp or antalgic gait.  Patient is able to perform finger-nose-finger and rapid alternating movements.  Skin: Patient has facial and scalp abrasions. Psychiatric: Patient is calm and cooperative.   ____________________________________________   LABS (all labs ordered are listed, but only abnormal results are displayed)  Labs Reviewed - No data to display ____________________________________________  EKG   ____________________________________________  RADIOLOGY   Dg Chest 2 View  Result Date: 02/02/2018 CLINICAL DATA:  Pain following fall EXAM: CHEST - 2 VIEW COMPARISON:  December 06, 2017 FINDINGS: The patient's mandible obscures portions of the lung apices. There is scarring in the right base. There is a minimal right pleural effusion. There is no edema or consolidation. There is no appreciable pneumothorax. Heart size is normal. Pulmonary vascularity is normal. Aorta is tortuous and mildly prominent, stable. There is aortic atherosclerosis. No adenopathy. No bone lesions. IMPRESSION: No evident pneumothorax. Scarring right base. Small  right pleural effusion. No edema or consolidation. Stable cardiac silhouette. Prominence of the aorta is likely due to chronic hypertension. There is aortic atherosclerosis. Aortic Atherosclerosis (ICD10-I70.0). Electronically Signed   By: Lowella Grip III M.D.   On: 02/02/2018 15:54   Ct Head Wo Contrast  Result Date: 02/02/2018 CLINICAL DATA:  Head injury and facial bruising after fall last night. EXAM: CT HEAD WITHOUT CONTRAST CT MAXILLOFACIAL WITHOUT CONTRAST CT CERVICAL SPINE WITHOUT CONTRAST TECHNIQUE: Multidetector CT imaging of the head, cervical spine, and maxillofacial structures were performed using the standard protocol without intravenous contrast. Multiplanar CT image reconstructions of the cervical spine and maxillofacial structures were also generated. COMPARISON:  None. FINDINGS: CT HEAD FINDINGS Brain: Mild diffuse cortical atrophy is noted. Mild chronic ischemic white matter disease is noted. No mass effect or midline shift is noted. Ventricular size is within normal limits. There is no evidence of mass lesion, hemorrhage or acute infarction. Vascular: No hyperdense vessel or unexpected calcification. Skull: Normal. Negative for fracture or focal lesion. Other: None. CT MAXILLOFACIAL FINDINGS Osseous: No fracture or mandibular dislocation. No destructive process. Orbits: Negative. No traumatic or inflammatory finding. Sinuses: Clear.  Soft tissues: Negative. CT CERVICAL SPINE FINDINGS Alignment: Mild reversal of normal lordosis of lower cervical spine is noted secondary to degenerative change. Skull base and vertebrae: No acute fracture. No primary bone lesion or focal pathologic process. Soft tissues and spinal canal: No prevertebral fluid or swelling. No visible canal hematoma. Disc levels: Moderate degenerative disc disease is noted at C5-6 and C6-7 with anterior osteophyte formation. Upper chest: Negative. Other: None. IMPRESSION: Mild diffuse cortical atrophy. Mild chronic ischemic  white matter disease. No acute intracranial abnormality seen. No abnormality seen in the maxillofacial region. Moderate multilevel degenerative disc disease. No acute abnormality seen in the cervical spine. Electronically Signed   By: Marijo Conception, M.D.   On: 02/02/2018 16:20   Ct Cervical Spine Wo Contrast  Result Date: 02/02/2018 CLINICAL DATA:  Head injury and facial bruising after fall last night. EXAM: CT HEAD WITHOUT CONTRAST CT MAXILLOFACIAL WITHOUT CONTRAST CT CERVICAL SPINE WITHOUT CONTRAST TECHNIQUE: Multidetector CT imaging of the head, cervical spine, and maxillofacial structures were performed using the standard protocol without intravenous contrast. Multiplanar CT image reconstructions of the cervical spine and maxillofacial structures were also generated. COMPARISON:  None. FINDINGS: CT HEAD FINDINGS Brain: Mild diffuse cortical atrophy is noted. Mild chronic ischemic white matter disease is noted. No mass effect or midline shift is noted. Ventricular size is within normal limits. There is no evidence of mass lesion, hemorrhage or acute infarction. Vascular: No hyperdense vessel or unexpected calcification. Skull: Normal. Negative for fracture or focal lesion. Other: None. CT MAXILLOFACIAL FINDINGS Osseous: No fracture or mandibular dislocation. No destructive process. Orbits: Negative. No traumatic or inflammatory finding. Sinuses: Clear. Soft tissues: Negative. CT CERVICAL SPINE FINDINGS Alignment: Mild reversal of normal lordosis of lower cervical spine is noted secondary to degenerative change. Skull base and vertebrae: No acute fracture. No primary bone lesion or focal pathologic process. Soft tissues and spinal canal: No prevertebral fluid or swelling. No visible canal hematoma. Disc levels: Moderate degenerative disc disease is noted at C5-6 and C6-7 with anterior osteophyte formation. Upper chest: Negative. Other: None. IMPRESSION: Mild diffuse cortical atrophy. Mild chronic ischemic  white matter disease. No acute intracranial abnormality seen. No abnormality seen in the maxillofacial region. Moderate multilevel degenerative disc disease. No acute abnormality seen in the cervical spine. Electronically Signed   By: Marijo Conception, M.D.   On: 02/02/2018 16:20   Dg Hand Complete Right  Result Date: 02/02/2018 CLINICAL DATA:  Fall with swelling to the hand EXAM: RIGHT HAND - COMPLETE 3+ VIEW COMPARISON:  None. FINDINGS: No acute displaced fracture or malalignment. Joint spaces are relatively maintained. Minimal degenerative change at the first [facility] joint. No radiopaque foreign body in the soft tissues. IMPRESSION: No acute osseous abnormality Electronically Signed   By: Donavan Foil M.D.   On: 02/02/2018 15:55   Ct Maxillofacial Wo Contrast  Result Date: 02/02/2018 CLINICAL DATA:  Head injury and facial bruising after fall last night. EXAM: CT HEAD WITHOUT CONTRAST CT MAXILLOFACIAL WITHOUT CONTRAST CT CERVICAL SPINE WITHOUT CONTRAST TECHNIQUE: Multidetector CT imaging of the head, cervical spine, and maxillofacial structures were performed using the standard protocol without intravenous contrast. Multiplanar CT image reconstructions of the cervical spine and maxillofacial structures were also generated. COMPARISON:  None. FINDINGS: CT HEAD FINDINGS Brain: Mild diffuse cortical atrophy is noted. Mild chronic ischemic white matter disease is noted. No mass effect or midline shift is noted. Ventricular size is within normal limits. There is no evidence of mass lesion, hemorrhage or acute  infarction. Vascular: No hyperdense vessel or unexpected calcification. Skull: Normal. Negative for fracture or focal lesion. Other: None. CT MAXILLOFACIAL FINDINGS Osseous: No fracture or mandibular dislocation. No destructive process. Orbits: Negative. No traumatic or inflammatory finding. Sinuses: Clear. Soft tissues: Negative. CT CERVICAL SPINE FINDINGS Alignment: Mild reversal of normal lordosis of lower  cervical spine is noted secondary to degenerative change. Skull base and vertebrae: No acute fracture. No primary bone lesion or focal pathologic process. Soft tissues and spinal canal: No prevertebral fluid or swelling. No visible canal hematoma. Disc levels: Moderate degenerative disc disease is noted at C5-6 and C6-7 with anterior osteophyte formation. Upper chest: Negative. Other: None. IMPRESSION: Mild diffuse cortical atrophy. Mild chronic ischemic white matter disease. No acute intracranial abnormality seen. No abnormality seen in the maxillofacial region. Moderate multilevel degenerative disc disease. No acute abnormality seen in the cervical spine. Electronically Signed   By: Marijo Conception, M.D.   On: 02/02/2018 16:20    ____________________________________________    PROCEDURES  Procedure(s) performed:    Procedures    Medications - No data to display   ____________________________________________   INITIAL IMPRESSION / ASSESSMENT AND PLAN / ED COURSE  Pertinent labs & imaging results that were available during my care of the patient were reviewed by me and considered in my medical decision making (see chart for details).  Review of the [place] CSRS was performed in accordance of the [place] prior to dispensing any controlled drugs.      ----------------------------------------- 3:03 PM on 02/02/2018 -----------------------------------------  Patient presents to the emergency department after experiencing a fall last night.  Patient fell from standing height and hit his head against concrete.  Initial physical exam was concerning for raccoon eyes. Patient is alert and oriented x3 with intact cranial nerves, symmetric strength and sensation with no hypo-or hyperreflexia.  Awaiting CT head, CT maxillofacial, CT cervical spine and chest x-ray.  Assessment and Plan:  Fall Differential diagnosis included subdural hematoma, blowout fracture, skull fracture and right hand  contusion.  CTs of the head, face and neck were reassuring without acute abnormality.  No evidence of pneumothorax was identified on chest x-ray.  X-ray examination of the right hand revealed no bony abnormality.  Neurologic exam and overall physical exam was reassuring.  Tylenol was recommended for pain at discharge.  All patient questions were answered.  ____________________________________________  FINAL CLINICAL IMPRESSION(S) / ED DIAGNOSES  Final diagnoses:  Fall, initial encounter      NEW MEDICATIONS STARTED DURING THIS VISIT:  ED Discharge Orders         Ordered    mupirocin ointment (BACTROBAN) 2 %     02/02/18 1706              This chart was dictated using voice recognition software/Dragon. Despite best efforts to proofread, errors can occur which can change the meaning. Any change was purely unintentional.    Karren Cobble 02/02/18 2040    Lavonia Drafts, MD 02/02/18 2156

## 2018-02-02 NOTE — ED Triage Notes (Addendum)
Pt presents via pov from home with daughters. Pt fell around midnight, when he went out to start the car for his daughter to go to work. He denies LOC, states he tripped while walking. Refused to go to hospital, but now has bruising around both eyes and swelling to his right hand. A;sp c/o pain to his right knee. Has abrasion to top of head, no bleeding noted. Pt alert & oriented with NAD noted.

## 2018-02-02 NOTE — ED Notes (Signed)
Pt fell night before last  Pt  Hit his head  On cement has  An abrasion  And swelling to right hand  Did  Not black out  He is awake and alert  No  Vomiting  Pt has pain at the abrasion site   Pt has  eccymosis   Around both  Eyes  He ambulates  Well with a steady gait  With  A walker  Family at bedside

## 2018-11-05 ENCOUNTER — Ambulatory Visit: Payer: Medicare Other | Attending: Infectious Diseases | Admitting: Infectious Diseases

## 2018-11-05 ENCOUNTER — Other Ambulatory Visit: Payer: Self-pay

## 2018-11-05 ENCOUNTER — Encounter: Payer: Self-pay | Admitting: Infectious Diseases

## 2018-11-05 ENCOUNTER — Other Ambulatory Visit
Admission: RE | Admit: 2018-11-05 | Discharge: 2018-11-05 | Disposition: A | Discharge status: Home / Self Care | Payer: Medicare Other | Source: Ambulatory Visit | Attending: Infectious Diseases | Admitting: Infectious Diseases

## 2018-11-05 VITALS — BP 129/82 | HR 77 | Temp 97.5°F | Ht 72.0 in

## 2018-11-05 DIAGNOSIS — L02416 Cutaneous abscess of left lower limb: Secondary | ICD-10-CM

## 2018-11-05 DIAGNOSIS — I10 Essential (primary) hypertension: Secondary | ICD-10-CM | POA: Diagnosis not present

## 2018-11-05 DIAGNOSIS — Z96652 Presence of left artificial knee joint: Secondary | ICD-10-CM

## 2018-11-05 DIAGNOSIS — Z79899 Other long term (current) drug therapy: Secondary | ICD-10-CM

## 2018-11-05 DIAGNOSIS — B965 Pseudomonas (aeruginosa) (mallei) (pseudomallei) as the cause of diseases classified elsewhere: Secondary | ICD-10-CM | POA: Diagnosis not present

## 2018-11-05 DIAGNOSIS — A498 Other bacterial infections of unspecified site: Secondary | ICD-10-CM

## 2018-11-05 DIAGNOSIS — Z8739 Personal history of other diseases of the musculoskeletal system and connective tissue: Secondary | ICD-10-CM

## 2018-11-05 DIAGNOSIS — C61 Malignant neoplasm of prostate: Secondary | ICD-10-CM | POA: Diagnosis not present

## 2018-11-05 DIAGNOSIS — Z792 Long term (current) use of antibiotics: Secondary | ICD-10-CM

## 2018-11-05 DIAGNOSIS — M25162 Fistula, left knee: Secondary | ICD-10-CM | POA: Insufficient documentation

## 2018-11-05 DIAGNOSIS — Z993 Dependence on wheelchair: Secondary | ICD-10-CM

## 2018-11-05 DIAGNOSIS — Z96659 Presence of unspecified artificial knee joint: Secondary | ICD-10-CM

## 2018-11-05 DIAGNOSIS — Z881 Allergy status to other antibiotic agents status: Secondary | ICD-10-CM

## 2018-11-05 LAB — C-REACTIVE PROTEIN: CRP: <0.8 mg/dL (ref <1.0)

## 2018-11-05 LAB — SEDIMENTATION RATE: Sed Rate: 6 mm/hr (ref 0–20)

## 2018-11-05 NOTE — Progress Notes (Signed)
NAME: [name]  DOB: 05/05/1935  MRN: [id number]  Date/Time: 11/05/2018 11:30 AM  REQUESTING PROVIDER Dr. Edwina Barth Subjective:  REASON FOR CONSULT: Left knee abscess ? [name] is a 84 y.o. male with a history of left TKA, Pseudomonas infection of the left knee, CA prostate on Casodex presents with his daughter for left knee infection.  Patient was followed by Dr. Ola Spurr.  And according to his medical records patient had a TKA of his left knee in 2010  and that got infected and he had to have a another surgery for removal and replacement.  Those details are not available.  As per Dr. Blane Ohara note since 2018 patient has had an abscess on the left knee almost like a fistula which opens up and oozes pus and he has cultured Pseudomonas.  Patient has been given intermittent courses of ciprofloxacin with good response leading to closure of the fistula only to recur after a few weeks.      As per his daughter he had a another flareup 3 weeks ago.  He saw his PCP Dr. Edwina Barth as Dr. Ola Spurr is not in town.  His PCP put him on ciprofloxacin on 10/24/2018.  The fistula with the abscess has cleared and it is closed now. Patient does not have any fever.  He is wheelchair-bound  Past Medical History:  Diagnosis Date  . Hypertension   . Sleep apnea   Renal mass CA prostate Atrial fibrillation GERD  Past Surgical History:  Procedure Laterality Date  . JOINT REPLACEMENT     total left knee replacement    Social history Never used tobacco No alcohol Lives with his daughter No family history on file. Allergies  Allergen Reactions  . Sulfa Antibiotics Rash and Swelling    Severe rash on lips/swelling of lips/bruising on nose and digits Severe rash on lips/swelling of lips/bruising on nose and digits     ? Current Outpatient Medications  Medication Sig Dispense Refill  . ciprofloxacin (CIPRO) 500 MG tablet Take 1 tablet by mouth 2 (two) times daily.    .  hydrochlorothiazide (HYDRODIURIL) 12.5 MG tablet Take 12.5 mg by mouth daily.  1  . mupirocin ointment (BACTROBAN) 2 % Apply to affected area 3 times daily 22 g 0  . simvastatin (ZOCOR) 20 MG tablet Take 1 tablet by mouth at bedtime.  3  . tamsulosin (FLOMAX) 0.4 MG CAPS capsule Take 1 capsule by mouth daily.  3  . vitamin B-12 (CYANOCOBALAMIN) 1000 MCG tablet Take 1 tablet by mouth daily.     No current facility-administered medications for this visit.      Abtx:  Anti-infectives (From admission, onward)   None      REVIEW OF SYSTEMS:  Const: negative fever, negative chills, negative weight loss Eyes: negative diplopia or visual changes, negative eye pain ENT: negative coryza, negative sore throat Resp: negative cough, hemoptysis, dyspnea Cards: negative for chest pain, palpitations, lower extremity edema GU: negative for frequency, dysuria and hematuria GI: Negative for abdominal pain, diarrhea, bleeding, constipation Skin: negative for rash and pruritus Heme: negative for easy bruising and gum/nose bleeding MS: Generalized weakness, inability to walk properly Neurolo:negative for headaches, dizziness, vertigo, some memory problems  Psych: negative for feelings of anxiety, depression  Endocrine: no polyuria or polydipsia Allergy/Immunology-sulfa allergy  Objective:  VITALS:  BP 129/82 (BP Location: Left Arm, Patient Position: Sitting, Cuff Size: Normal)   Pulse 77   Temp (!) 97.5 F (36.4 C) (Oral)   Ht 6' (  1.829 m)   BMI 28.07 kg/m  PHYSICAL EXAM: Examination limited as patient in wheelchair in a wheelchair General: Alert, cooperative, no distress, appears stated age.  Head: Normocephalic, without obvious abnormality, atraumatic. Eyes: Not examined ENT: Not examined as he has a mask Neck: Supple,  no carotid bruit and no JVD. Lungs: Clear to auscultation bilaterally. No Wheezing or Rhonchi. No rales. Heart: S1-S2 Abdomen: Not examined is in wheelchair Extremities:  Left knee has a surgical scar.  Medial aspect of the knee there is a circular area of soft scar tissue which is close currently.   Discoloration around atraumatic, no cyanosis. No edema. No clubbing Skin: No rashes or lesions. Or bruising Lymph: Cervical, supraclavicular normal. Neurologic: Grossly non-focal Pertinent Labs Lab Results Labs 10/17/2018 WBC 7.5 Hemoglobin 14.7 Platelet 188 Creatinine 0.9 CO2 37.1 Albumin 3.3  ? Impression/Recommendation ?84 year old male with history of left TKA complicated by infection and removal of prosthesis with reimplantation presents with 2-year history of intermittent swelling and discharge of pus in the left knee.  Has had Pseudomonas cultured from the past and has been on ciprofloxacin intermittently.  As the patient is too frail for repeat surgery it has been decided to give him antibiotics to suppress the infection. Because of side effects from chronic use of ciprofloxacin especially encephalopathy, tendinitis, ligament rupture it has been decided to treat with Symproic intermittently.  He is now finishing a 14-day course of ciprofloxacin and the fistula/abscess has healed. We will check a ESR and CRP. Gave patient's daughter as culture swab, place an order for culture and when the next episode happens she will culture it to make sure that it is real sensitive to ciprofloxacin.  We will continue the current management of intermittent ciprofloxacin use as and when needed.  The other option is to do suppressive therapy with 500 mg of Cipro 1 dose a day.  But still at risk for side effects.  CA prostate on Casodex and tamsulosin  Hypertension on hydrochlorothiazide  On B 12 ? ___________________________________________________ Discussed with patient, and his daughter We will follow as needed Note:  This document was prepared using Dragon voice recognition software and may include unintentional dictation errors.

## 2018-11-05 NOTE — Patient Instructions (Addendum)
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 You will complete the full course. When you have pus again please do a culture Will do ESR/CRP today

## 2019-07-16 ENCOUNTER — Emergency Department: Payer: Medicare Other

## 2019-07-16 ENCOUNTER — Other Ambulatory Visit: Payer: Self-pay

## 2019-07-16 ENCOUNTER — Encounter: Payer: Self-pay | Admitting: Emergency Medicine

## 2019-07-16 ENCOUNTER — Emergency Department
Admission: EM | Admit: 2019-07-16 | Discharge: 2019-07-16 | Disposition: A | Discharge status: Home / Self Care | Payer: Medicare Other | Attending: Emergency Medicine | Admitting: Emergency Medicine

## 2019-07-16 DIAGNOSIS — Z96652 Presence of left artificial knee joint: Secondary | ICD-10-CM | POA: Insufficient documentation

## 2019-07-16 DIAGNOSIS — Z79899 Other long term (current) drug therapy: Secondary | ICD-10-CM | POA: Diagnosis not present

## 2019-07-16 DIAGNOSIS — R0602 Shortness of breath: Secondary | ICD-10-CM | POA: Diagnosis present

## 2019-07-16 DIAGNOSIS — I10 Essential (primary) hypertension: Secondary | ICD-10-CM | POA: Diagnosis not present

## 2019-07-16 DIAGNOSIS — J40 Bronchitis, not specified as acute or chronic: Secondary | ICD-10-CM | POA: Diagnosis not present

## 2019-07-16 LAB — BASIC METABOLIC PANEL
Anion gap: 12 (ref 5–15)
BUN: 18 mg/dL (ref 8–23)
CO2: 30 mmol/L (ref 22–32)
Calcium: 9.2 mg/dL (ref 8.9–10.3)
Chloride: 98 mmol/L (ref 98–111)
Creatinine, Ser: 0.89 mg/dL (ref 0.61–1.24)
GFR calc Af Amer: >60 mL/min (ref >60)
GFR calc non Af Amer: >60 mL/min (ref >60)
Glucose, Bld: 108 mg/dL — ABNORMAL HIGH (ref 70–99)
Potassium: 3.3 mmol/L — ABNORMAL LOW (ref 3.5–5.1)
Sodium: 140 mmol/L (ref 135–145)

## 2019-07-16 LAB — TROPONIN I (HIGH SENSITIVITY)
Troponin I (High Sensitivity): 7 ng/L (ref <18)
Troponin I (High Sensitivity): 8 ng/L (ref <18)

## 2019-07-16 LAB — CBC
HCT: 50.2 % (ref 39.0–52.0)
Hemoglobin: 16 g/dL (ref 13.0–17.0)
MCH: 30.1 pg (ref 26.0–34.0)
MCHC: 31.9 g/dL (ref 30.0–36.0)
MCV: 94.5 fL (ref 80.0–100.0)
Platelets: 192 10*3/uL (ref 150–400)
RBC: 5.31 MIL/uL (ref 4.22–5.81)
RDW: 12.5 % (ref 11.5–15.5)
WBC: 9.4 10*3/uL (ref 4.0–10.5)
nRBC: 0 % (ref 0.0–0.2)

## 2019-07-16 MED ORDER — SODIUM CHLORIDE 0.9% FLUSH: 3.0000 mL | Freq: Once | INTRAVENOUS | Status: DC

## 2019-07-16 MED ORDER — ALBUTEROL SULFATE HFA 108 (90 BASE) MCG/ACT IN AERS: 2.0000 | INHALATION_SPRAY | Freq: Four times a day (QID) | RESPIRATORY_TRACT | 0 refills | Status: AC | PRN

## 2019-07-16 MED ORDER — IOHEXOL 350 MG/ML SOLN
75.0000 mL | Freq: Once | INTRAVENOUS | Status: AC | PRN
  Administered 2019-07-16: 75 mL via INTRAVENOUS

## 2019-07-16 MED ORDER — ALBUTEROL SULFATE (2.5 MG/3ML) 0.083% IN NEBU
2.5000 mg | INHALATION_SOLUTION | Freq: Once | RESPIRATORY_TRACT | Status: AC
  Administered 2019-07-16: 2.5 mg via RESPIRATORY_TRACT
  Filled 2019-07-16: qty 3

## 2019-07-16 NOTE — ED Provider Notes (Signed)
[facility] Emergency Department Provider Note ____________________________________________   First MD Initiated Contact with Patient 07/16/19 1609     (approximate)  I have reviewed the triage vital signs and the nursing notes.   HISTORY  Chief Complaint Shortness of Breath and Chest Pain    HPI [name] is a 85 y.o. male with PMH as noted below who presents with chest pain, acute onset this morning, described as sharp and mainly under the left breast.  It is worse with deep inspiration.  He denies any prior history of this pain.  He has some associated shortness of breath that started yesterday evening.  In addition he reports chronic right-sided neck pain.  The daughter states that this has been ongoing for a long time and is due to the position that he sleeps in.  The patient denies any leg pain or swelling.  He has had no cough or fever.  Past Medical History:  Diagnosis Date  . Hypertension   . Sleep apnea     Patient Active Problem List   Diagnosis Date Noted  . Pseudomonas infection 11/05/2018  . 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 JOINT REPLACEMENT     total left knee replacement    Prior to Admission medications   Medication Sig Start Date End Date Taking? Authorizing Provider  bicalutamide (CASODEX) 50 MG tablet Take 50 mg by mouth daily. 06/08/19  Yes [provider]  ciprofloxacin (CIPRO) 500 MG tablet Take 1 tablet by mouth daily.  11/25/17  Yes [provider]  hydrochlorothiazide (HYDRODIURIL) 12.5 MG tablet Take 12.5 mg by mouth daily. 10/03/17  Yes [provider]  tamsulosin (FLOMAX) 0.4 MG CAPS capsule Take 1 capsule by mouth daily. 11/13/17  Yes [provider]  albuterol (VENTOLIN HFA) 108 (90 Base) MCG/ACT inhaler Inhale 2 puffs into the lungs every 6 (six) hours as needed for up to 5 days for wheezing or  shortness of breath. 07/16/19 07/21/19  Arta Silence, MD    Allergies Sulfa antibiotics  History reviewed. No pertinent family history.  Social History Social History   Tobacco Use  . Smoking status: Never Smoker  . Smokeless tobacco: Never Used  Substance Use Topics  . Alcohol use: No  . Drug use: No    Review of Systems  Constitutional: No fever. Eyes: No redness. ENT: No sore throat. Cardiovascular: Positive for chest pain. Respiratory: Positive for shortness of breath. Gastrointestinal: No vomiting or diarrhea.  Genitourinary: Negative for flank pain.  Musculoskeletal: Negative for back pain. Skin: Negative for rash. Neurological: Negative for headache.   ____________________________________________   PHYSICAL EXAM:  VITAL SIGNS: ED Triage Vitals  Enc Vitals Group     BP 07/16/19 1423 126/80     Pulse Rate 07/16/19 1423 (!) 124     Resp 07/16/19 1423 20     Temp 07/16/19 1423 97.8 F (36.6 C)     Temp Source 07/16/19 1423 Oral     SpO2 07/16/19 1423 95 %     Weight 07/16/19 1429 114 lb (51.7 kg)     Height 07/16/19 1429 6' (1.829 m)     Head Circumference --      Peak Flow --      Pain Score 07/16/19 1426 10     Pain Loc --      Pain Edu? --      Excl. in [place]? --  Constitutional: Alert and oriented. Well appearing and in no acute distress. Eyes: Conjunctivae are normal.  Head: Atraumatic. Nose: No congestion/rhinnorhea. Mouth/Throat: Mucous membranes are moist.   Neck: Normal range of motion.  Cardiovascular: Normal rate, regular rhythm. Grossly normal heart sounds.  Good peripheral circulation. Respiratory: Normal respiratory effort.  No retractions.  Faint wheezing bilaterally but worse on the left. Gastrointestinal: No distention.  Genitourinary: No CVA tenderness. Musculoskeletal: No lower extremity edema.  Extremities warm and well perfused.  Neurologic: Motor intact in all extremities. Skin:  Skin is warm and dry. No rash noted.  Psychiatric: Calm and cooperative.  ____________________________________________   LABS (all labs ordered are listed, but only abnormal results are displayed)  Labs Reviewed  BASIC METABOLIC PANEL - Abnormal; Notable for the following components:      Result Value   Potassium 3.3 (*)    Glucose, Bld 108 (*)    All other components within normal limits  CBC  TROPONIN I (HIGH SENSITIVITY)  TROPONIN I (HIGH SENSITIVITY)   ____________________________________________  EKG  ED ECG REPORT I, Arta Silence, the attending physician, personally viewed and interpreted this ECG.  Date: 07/16/2019 EKG Time: 1428 Rate: 89 Rhythm: Sinus rhythm with frequent PACs QRS Axis: normal Intervals: LAFB ST/T Wave abnormalities: Nonspecific ST abnormalities Narrative Interpretation: Nonspecific abnormalities with no evidence of acute ischemia; no significant change when compared to EKG of 12/06/2017  ____________________________________________  RADIOLOGY  CXR: No focal infiltrate or other acute abnormality CT angio chest: No PE or other acute abnormalities  ____________________________________________   PROCEDURES  Procedure(s) performed: No  Procedures  Critical Care performed: No ____________________________________________   INITIAL IMPRESSION / ASSESSMENT AND PLAN / ED COURSE  Pertinent labs & imaging results that were available during my care of the patient were reviewed by me and considered in my medical decision making (see chart for details).  85 year old male with PMH as noted above presents with atypical sharp inspiratory left-sided chest pain associated with some shortness of breath since last night.  I reviewed the past medical records in [place].  The patient was most recently seen in the ED in 2019 after a fall.  He has not been admitted within the last several years.  He was last seen by the cardiologist Dr. Tomasita Crumble was last month and reported mild nonexertional  shortness of breath at that time.  He has a history of paroxysmal atrial fibrillation.  On exam currently, the patient is overall relatively well-appearing.  His vital signs are normal, except that the O2 saturation ranges around 94 and sometimes down to 92% on room air.  He was initially tachycardic but this has resolved without intervention.  He does have some wheezes especially in the left lung.  The physical exam is otherwise unremarkable.  EKG is nonischemic.  Overall I suspect acute bronchitis or less likely pneumonia.  Differential also includes musculoskeletal chest wall pain, or other benign etiology such as GERD.  We will obtain a chest x-ray, cardiac enzymes, basic labs, and reassess.  I have a low suspicion for PE, however given the tachycardia initially and the pleuritic nature of the pain we will obtain a CT to further evaluate if there are no significant findings on the chest x-ray.   ----------------------------------------- 6:35 PM on 07/16/2019 -----------------------------------------  The chest x-ray, CT chest and lab work-up are all within normal limits, including troponins x2.  The patient continues to appear comfortable with O2 saturation in the mid 90s on room air.  He has no respiratory  distress.  At this time, he is stable for discharge home.  I counseled him and the daughter on the results of the work-up.  I recommended a trial of albuterol for the next several days given that he had wheezing.  I gave them thorough return precautions and they expressed understanding.    ____________________________________________   FINAL CLINICAL IMPRESSION(S) / ED DIAGNOSES  Final diagnoses:  Shortness of breath  Bronchitis      NEW MEDICATIONS STARTED DURING THIS VISIT:  New Prescriptions   ALBUTEROL (VENTOLIN HFA) 108 (90 BASE) MCG/ACT INHALER    Inhale 2 puffs into the lungs every 6 (six) hours as needed for up to 5 days for wheezing or shortness of breath.     Note:   This document was prepared using Dragon voice recognition software and may include unintentional dictation errors.   Arta Silence, MD 07/16/19 1836

## 2019-07-16 NOTE — ED Triage Notes (Signed)
Pt presents to ED via POV with c/o SOB that started yesterday and CP that started earlier today. Pt states L sided CP, denies radiation. Pt states pain is sharp, also c/o SOB, states yesterday broke out into a "cold sweat".    Pt states pain is increased with deep inspiration .

## 2019-07-16 NOTE — ED Notes (Signed)
See triage note. Pt c/o CP that started this morning , neck pain, and SHOB that started yesterday.  Denies N/V.  Pt in NAD at this time.  Reports hx HTN

## 2019-07-16 NOTE — ED Notes (Signed)
Pt in CT at this time.

## 2019-07-16 NOTE — Discharge Instructions (Addendum)
Use the albuterol inhaler every 4-6 hours over the next several days.  Follow-up with the primary care doctor in 1 to 2 weeks.  Return to the ER for new, worsening, or persistent severe chest pain, shortness of breath, weakness, or any other new or worsening symptoms that concern you.

## 2020-02-06 DEATH — deceased

## 2021-10-04 IMAGING — CT CT ANGIO CHEST
2 of 6 series · 18 of 46 positions shown · IV contrast (omnipaque)
Comparison: Plain film from earlier in the same day as well as a
chest CT from 12/03/2017

CLINICAL DATA: Shortness of breath and chest pain for several hours

EXAM:
CT ANGIOGRAPHY CHEST WITH CONTRAST
TECHNIQUE: Multidetector CT imaging of the chest was performed using the
standard protocol during bolus administration of intravenous
contrast. Multiplanar CT image reconstructions and MIPs were
obtained to evaluate the vascular anatomy.
CONTRAST:  75mL OMNIPAQUE IOHEXOL 350 MG/ML SOLN

[Series 5: thins · axial · 0.71mm/px · z∈[+47,+263]mm · 15 of 238 slices shown]
[im 11/238  lung]
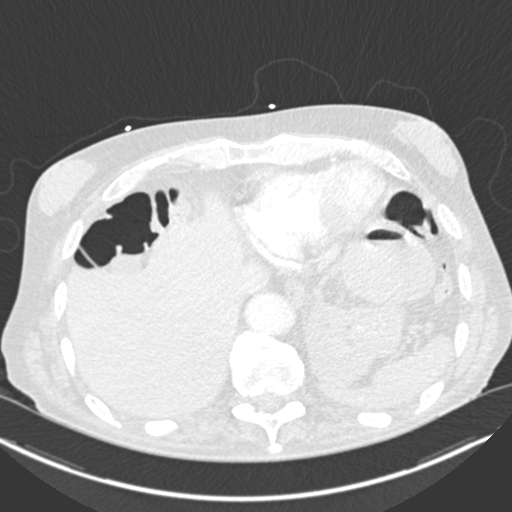
[im 31/238  soft-tissue]
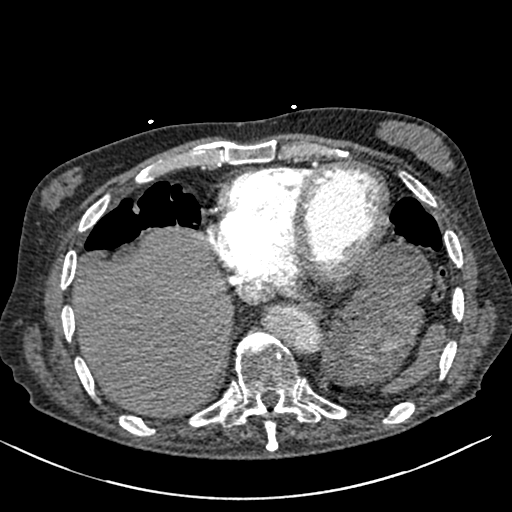
[im 42/238  lung]
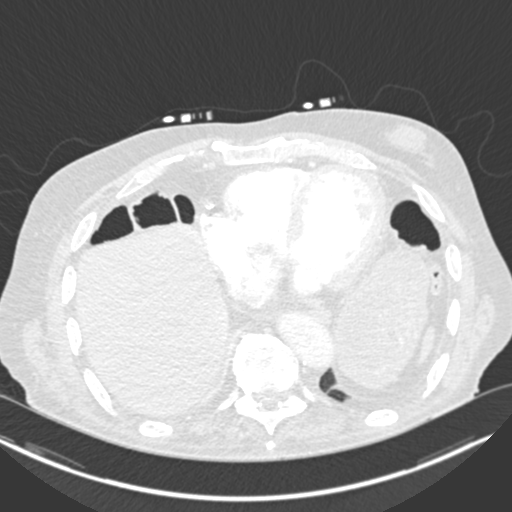
[im 62/238  soft-tissue]
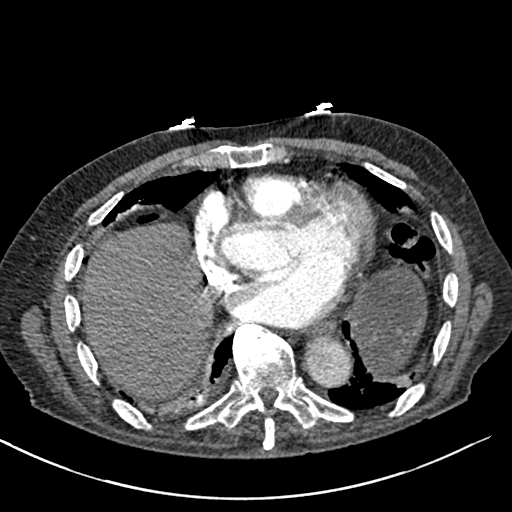
[im 73/238  lung]
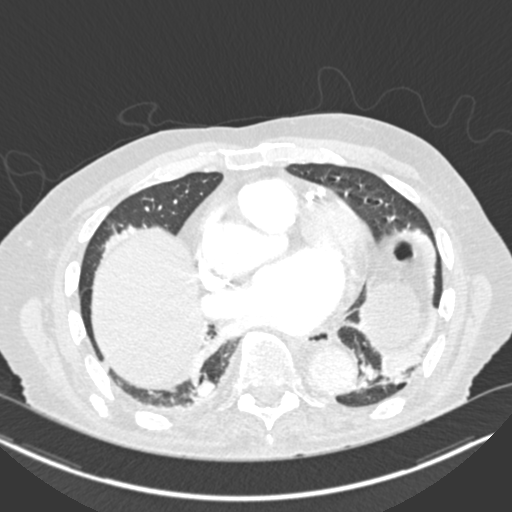
[im 93/238  soft-tissue]
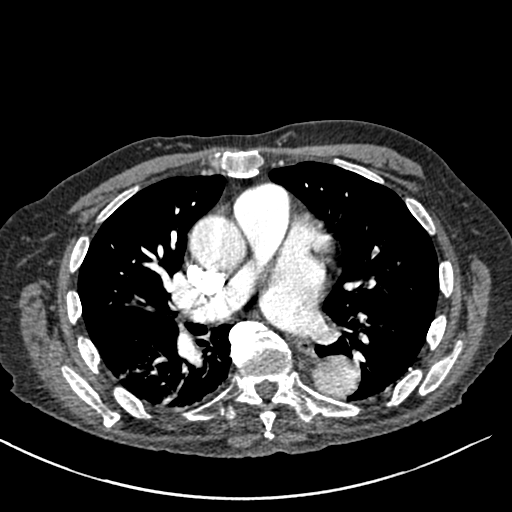
[im 104/238  lung]
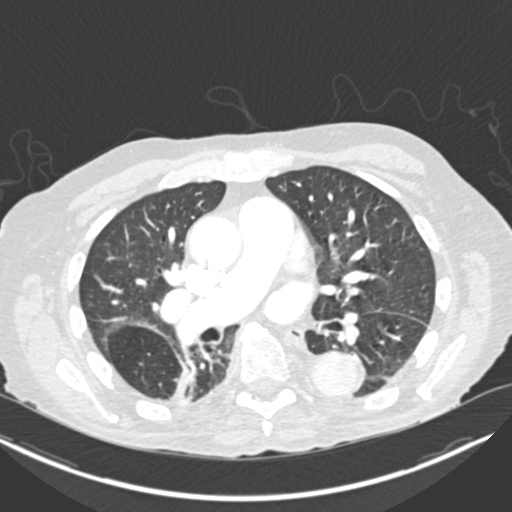
[im 124/238  soft-tissue]
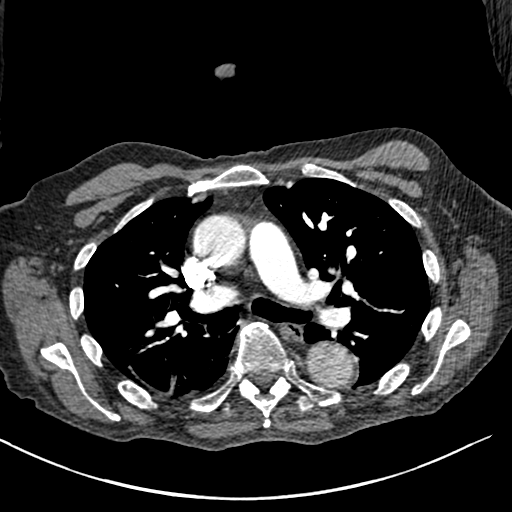
[im 134/238  lung]
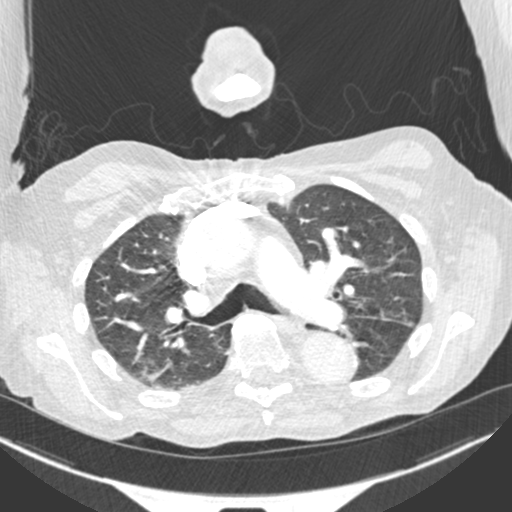
[im 145/238  soft-tissue]
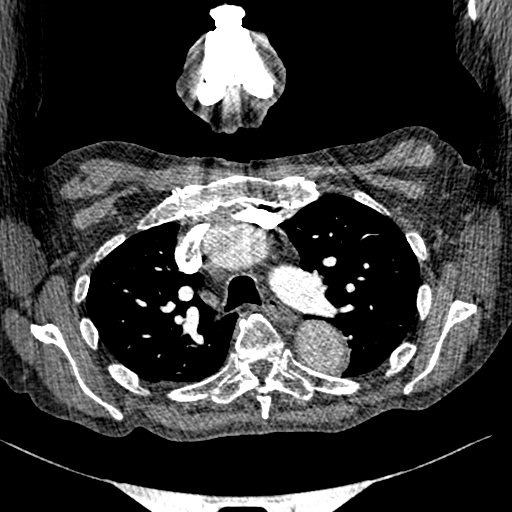
[im 165/238  lung]
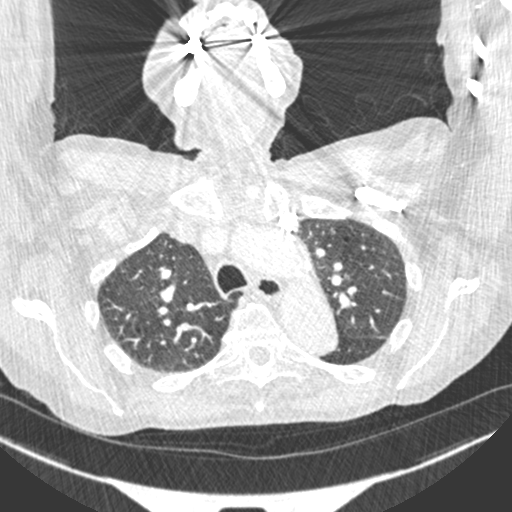
[im 176/238  soft-tissue]
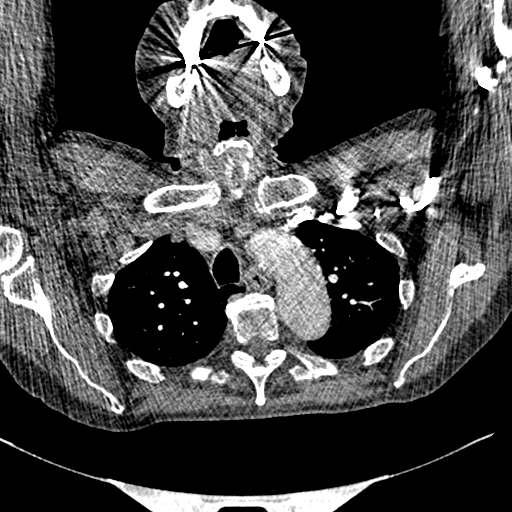
[im 196/238  lung]
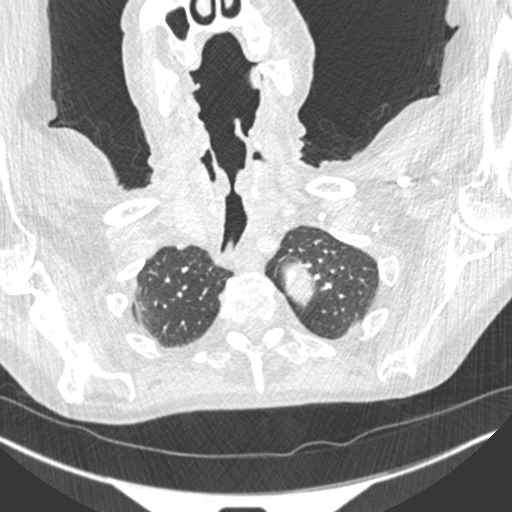
[im 207/238  soft-tissue]
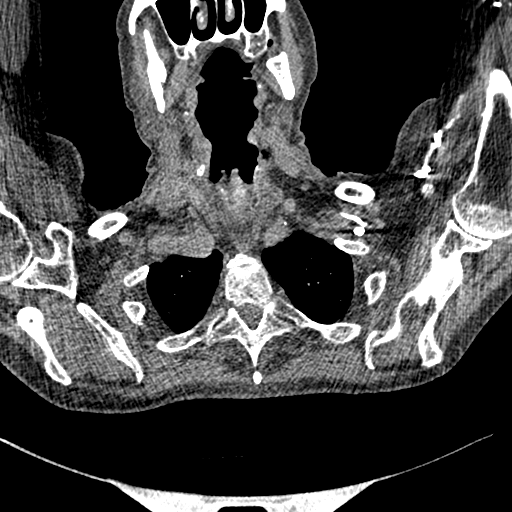
[im 227/238  lung]
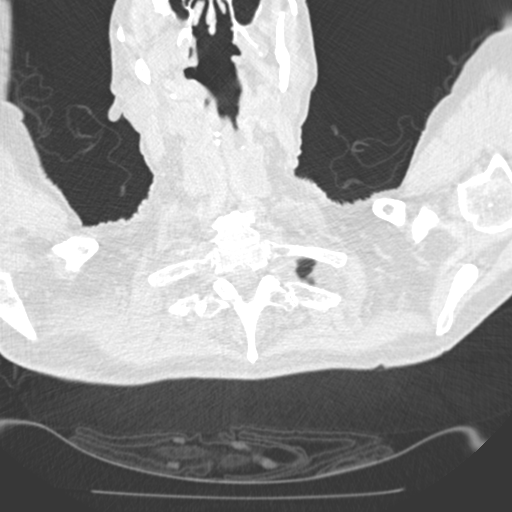

[Series 7: coronal mpr · coronal · 0.49mm/px · 3 of 78 slices shown]
[im 20/78  soft-tissue]
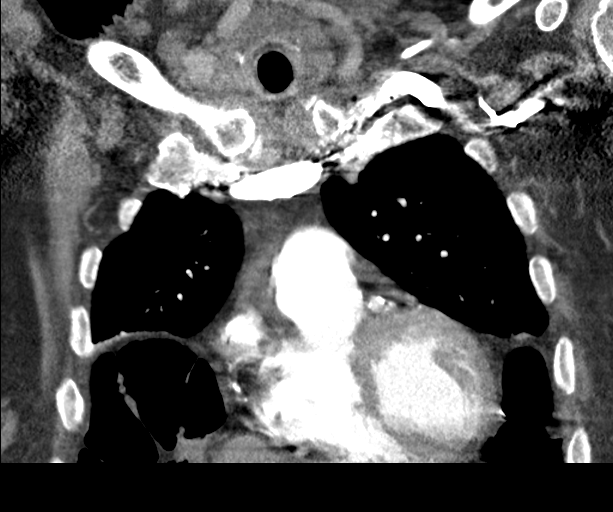
[im 39/78  soft-tissue]
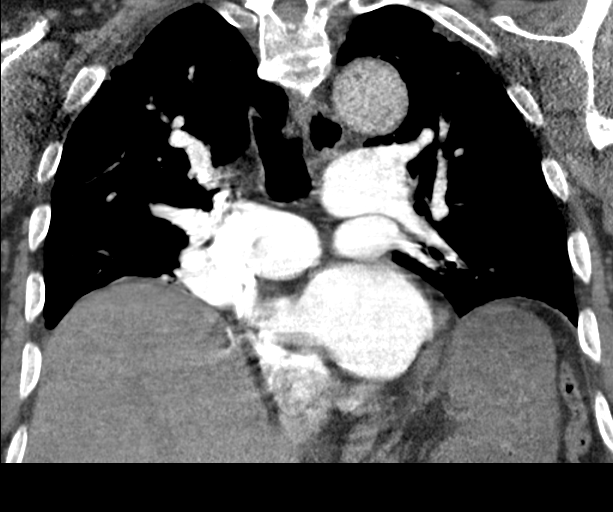
[im 58/78  soft-tissue]
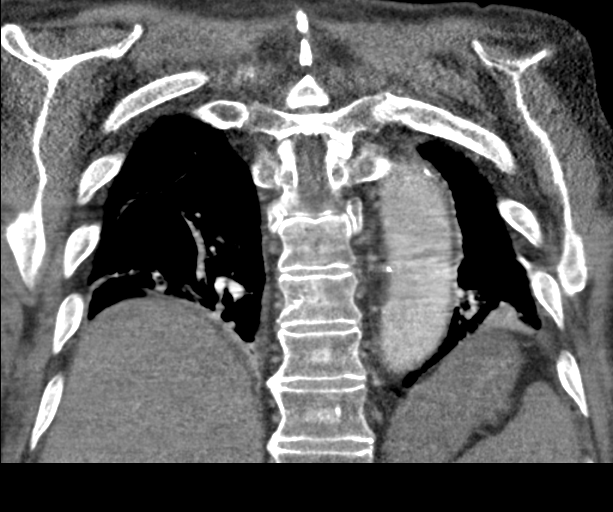

[18 of 46 positions shown; findings below may reference images not displayed]

FINDINGS: Cardiovascular: Thoracic aorta and its branches demonstrate
atherosclerotic calcifications without aneurysmal dilatation or
dissection. No cardiac enlargement is noted. Multifocal coronary
calcifications are seen. The pulmonary artery shows a normal
branching pattern without filling defect to suggest pulmonary
embolism.

Mediastinum/Nodes: Thoracic inlet is within normal limits. No hilar
or mediastinal adenopathy is noted. The esophagus as visualized is
within normal limits.

Lungs/Pleura: Mild bibasilar scarring is noted stable from 4442. No
focal infiltrate or sizable effusion is seen.

Upper Abdomen: Visualized upper abdomen shows no acute abnormality.

Musculoskeletal: Degenerative changes of the thoracic spine are
seen. No acute rib abnormality is noted. Mild gynecomastia is noted
bilaterally.

Review of the MIP images confirms the above findings.
IMPRESSION: No evidence of pulmonary emboli.

Bibasilar scarring stable from 4442.

Mild gynecomastia.

Aortic Atherosclerosis ([id number]).
